# Patient Record
Sex: Male | Born: 1955
Health system: Southern US, Community
[De-identification: ages and names within clinical notes are randomized; demographics above are authoritative.]

## PROBLEM LIST (undated history)

## (undated) DIAGNOSIS — G609 Hereditary and idiopathic neuropathy, unspecified: Secondary | ICD-10-CM

## (undated) DIAGNOSIS — N529 Male erectile dysfunction, unspecified: Secondary | ICD-10-CM

## (undated) DIAGNOSIS — I440 Atrioventricular block, first degree: Secondary | ICD-10-CM

## (undated) DIAGNOSIS — C4490 Unspecified malignant neoplasm of skin, unspecified: Secondary | ICD-10-CM

## (undated) DIAGNOSIS — R200 Anesthesia of skin: Secondary | ICD-10-CM

## (undated) DIAGNOSIS — J309 Allergic rhinitis, unspecified: Secondary | ICD-10-CM

## (undated) DIAGNOSIS — G629 Polyneuropathy, unspecified: Secondary | ICD-10-CM

## (undated) DIAGNOSIS — R002 Palpitations: Secondary | ICD-10-CM

## (undated) DIAGNOSIS — C44519 Basal cell carcinoma of skin of other part of trunk: Secondary | ICD-10-CM

## (undated) HISTORY — DX: Allergic rhinitis, unspecified: J30.9

## (undated) HISTORY — DX: Atrioventricular block, first degree: I44.0

## (undated) HISTORY — DX: Palpitations: R00.2

## (undated) HISTORY — DX: Anesthesia of skin: R20.0

## (undated) HISTORY — DX: Basal cell carcinoma of skin of other part of trunk: C44.519

## (undated) HISTORY — DX: Hereditary and idiopathic neuropathy, unspecified: G60.9

## (undated) HISTORY — DX: Unspecified malignant neoplasm of skin, unspecified: C44.90

## (undated) HISTORY — DX: Male erectile dysfunction, unspecified: N52.9

---
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
## 2009-06-13 ENCOUNTER — Emergency Department (HOSPITAL_BASED_OUTPATIENT_CLINIC_OR_DEPARTMENT_OTHER): Admission: EM | Admit: 2009-06-13 | Discharge: 2009-06-14 | Payer: Self-pay | Admitting: Emergency Medicine

## 2009-06-14 ENCOUNTER — Ambulatory Visit: Payer: Self-pay | Admitting: Diagnostic Radiology

## 2009-06-15 ENCOUNTER — Telehealth: Payer: Self-pay | Admitting: Cardiology

## 2009-06-15 DIAGNOSIS — R002 Palpitations: Secondary | ICD-10-CM | POA: Insufficient documentation

## 2009-06-27 ENCOUNTER — Telehealth: Payer: Self-pay | Admitting: Cardiology

## 2009-07-17 ENCOUNTER — Ambulatory Visit: Payer: Self-pay

## 2009-07-17 ENCOUNTER — Ambulatory Visit (HOSPITAL_COMMUNITY): Admission: RE | Admit: 2009-07-17 | Discharge: 2009-07-17 | Payer: Self-pay | Admitting: Cardiology

## 2009-07-17 ENCOUNTER — Encounter: Payer: Self-pay | Admitting: Cardiology

## 2009-07-17 ENCOUNTER — Ambulatory Visit: Payer: Self-pay | Admitting: Internal Medicine

## 2009-07-24 ENCOUNTER — Ambulatory Visit: Payer: Self-pay | Admitting: Cardiology

## 2010-02-12 NOTE — Progress Notes (Signed)
Summary: schedule 48 hour holter  Phone Note Outgoing Call   Call placed by: Katina Dung, RN, BSN,  June 15, 2009 12:40 PM Call placed to: Patient Summary of Call: schedule post ER 48 hour holter  Follow-up for Phone Call        pt was seen in ER 06/13/09--reviewed with Dr Darvin Neighbours recommended 48 holter monitor for palpitations and appt with him after pt has completed monitor --appt with Dr Shirlee Latch is scheduled for 06/27/09--LMTCB Katina Dung, RN, BSN  June 15, 2009 12:42 PM  discussed with pt-he agreed to 48 hour monitor-he has BC/BS insurance  New Problems: PALPITATIONS (ICD-785.1)   New Problems: PALPITATIONS (ICD-785.1)

## 2010-02-12 NOTE — Assessment & Plan Note (Signed)
Summary: np6/seen in er/jss   Primary Provider:  Dr. Foy Guadalajara  CC:  new patient seen in er.  Pt feeling great today. Marland Kitchen  History of Present Illness: 55 yo presents for evaluation of palpitations after recent ER visit.  Patient went to the ER in 6/11 with irregular heart beat and palpitations.  This developed after he started doxycycline that had been prescribed by his PCP for tick exposure.  He felt his heart skipping beats for about 6 hours, then it resolved.  It really has not happened since.  In the ER, PVCs were noted on telemetry.  His electrolytes appeared normal and cardiac enzymes were negative.  Currently, patient is doing well with no palpitations.  He has no exertional dyspnea or chest pain and is doing well in general.  He had a holter done this month (of note, he was having no palpitations).  This showed only a few PVCs.  Echo showed a structurally normal heart.  Of note, patient does drink a significant amount of caffeine.  No recent over the counter cold meds.    ECG: NSR, normal (from 6/11 ER visit)  Labs (6/11): HCT 41.8, K 4.1, creatinine 0.8, cardiac enzymes negative   Current Medications (verified): 1)  None  Allergies (verified): No Known Drug Allergies  Past History:  Social History: Last updated: 07/24/2009 Tobacco Use - No.  Alcohol Use - no Drug Use - no Married Drinks a significant amount of caffeine.   Past Medical History: 1.  Palpitations: Holter (7/11) with occasional PACs, no significant arrhythmia. Narrow QRS, normal QTc.  2.  Echo (7/11): EF 50-55% with no regional wall motion abnormalities and normal diastolic function.  No valvular abnormalities.    Family History: No premature CAD or sudden death.   Social History: Tobacco Use - No.  Alcohol Use - no Drug Use - no Married Drinks a significant amount of caffeine.   Review of Systems       All systems reviewed and negative except as per HPI.   Vital Signs:  Patient profile:   55 year old  male Height:      73 inches Weight:      183 pounds BMI:     24.23 Pulse rate:   74 / minute Pulse rhythm:   regular BP sitting:   120 / 72  (left arm) Cuff size:   regular  Vitals Entered By: [name] CMA (July 24, 2009 3:40 PM)  Physical Exam  General:  Well developed, well nourished, in no acute distress. Head:  normocephalic and atraumatic Nose:  no deformity, discharge, inflammation, or lesions Mouth:  Teeth, gums and palate normal. Oral mucosa normal. Neck:  Neck supple, no JVD. No masses, thyromegaly or abnormal cervical nodes. Lungs:  Clear bilaterally to auscultation and percussion. Heart:  Non-displaced PMI, chest non-tender; regular rate and rhythm, S1, S2 without murmurs, rubs or gallops. Carotid upstroke normal, no bruit.  Pedals normal pulses. No edema, no varicosities. Abdomen:  Bowel sounds positive; abdomen soft and non-tender without masses, organomegaly, or hernias noted. No hepatosplenomegaly. Msk:  Back normal, normal gait. Muscle strength and tone normal. Extremities:  No clubbing or cyanosis. Neurologic:  Alert and oriented x 3. Skin:  Intact without lesions or rashes. Psych:  Normal affect.   Impression & Recommendations:  Problem # 1:  PALPITATIONS (ICD-785.1) Patient had an episode of about 6 hours of frequent palpitations after taking doxycycline.  PVCs were noted in the ER.  Subsequent Holter showed only rare  PACs and echo showed a structurally normal heart.  Doxycycline is not known to significantly increase arrhythmia risk.  At this time, the patient is doing well with no symptoms and I do not think any further workup is needed.   Patient Instructions: 1)  Your physician recommends that you schedule a follow-up appointment as needed with Dr Shirlee Latch.

## 2010-02-12 NOTE — Progress Notes (Signed)
Summary: call back after his meeting  Phone Note Call from Patient Call back at Home Phone [phone]   Caller: Patient Reason for Call: Talk to Nurse Summary of Call: per pt calling back, will call back after his meeting. Initial call taken by: Lorne Skeens,  June 27, 2009 9:06 AM  Follow-up for Phone Call        talked with patient by telephone--He states he was never contacted to schedule holter monitor--pt states since the day after the  ER visit 06-13-09 he has not had anymore symptoms--I discussed with Dr Darvin Neighbours recommended if pt has been asymptomatic he could get echo and 48 hour monitor then see Dr Shirlee Latch in the office following completion of the echo and monitor--pt to call me back after meeting to discuss--I talked with pt --he will schedule echo and holter monitor then followup  with Dr Shirlee Latch

## 2010-02-12 NOTE — Procedures (Signed)
Summary: summary report  summary report   Imported By: Mirna Mires 07/30/2009 10:05:04  _____________________________________________________________________  External Attachment:    Type:   Image     Comment:   External Document

## 2010-04-01 LAB — BASIC METABOLIC PANEL WITH GFR
BUN: 14 mg/dL (ref 6–23)
CO2: 27 meq/L (ref 19–32)
Calcium: 8.9 mg/dL (ref 8.4–10.5)
Chloride: 104 meq/L (ref 96–112)
Creatinine, Ser: 0.8 mg/dL (ref 0.4–1.5)
GFR calc non Af Amer: >60 mL/min
Glucose, Bld: 149 mg/dL — ABNORMAL HIGH (ref 70–99)
Potassium: 4.1 meq/L (ref 3.5–5.1)
Sodium: 142 meq/L (ref 135–145)

## 2010-04-01 LAB — CBC
HCT: 41.8 % (ref 39.0–52.0)
Hemoglobin: 14.2 g/dL (ref 13.0–17.0)
MCHC: 34 g/dL (ref 30.0–36.0)
MCV: 92.6 fL (ref 78.0–100.0)
Platelets: 237 K/uL (ref 150–400)
RBC: 4.51 MIL/uL (ref 4.22–5.81)
RDW: 11.7 % (ref 11.5–15.5)
WBC: 5.9 K/uL (ref 4.0–10.5)

## 2010-04-01 LAB — DIFFERENTIAL
Eosinophils Absolute: 0.1 10*3/uL (ref 0.0–0.7)
Eosinophils Relative: 2 % (ref 0–5)
Lymphs Abs: 1.1 10*3/uL (ref 0.7–4.0)
Monocytes Absolute: 0.8 10*3/uL (ref 0.1–1.0)
Neutro Abs: 3.9 10*3/uL (ref 1.7–7.7)

## 2010-04-01 LAB — POCT CARDIAC MARKERS
CKMB, poc: <1 ng/mL — ABNORMAL LOW (ref 1.0–8.0)
Myoglobin, poc: 72 ng/mL (ref 12–200)
Troponin i, poc: <0.05 ng/mL (ref 0.00–0.09)

## 2010-04-01 LAB — TSH: TSH: 2.081 u[IU]/mL (ref 0.350–4.500)

## 2011-06-02 IMAGING — CR DG CHEST 2V
2 series · 2 of 2 positions shown · non-contrast
Comparison: None.

CLINICAL DATA: Irregular heart beat.

CHEST - 2 VIEW

[w chest pa]
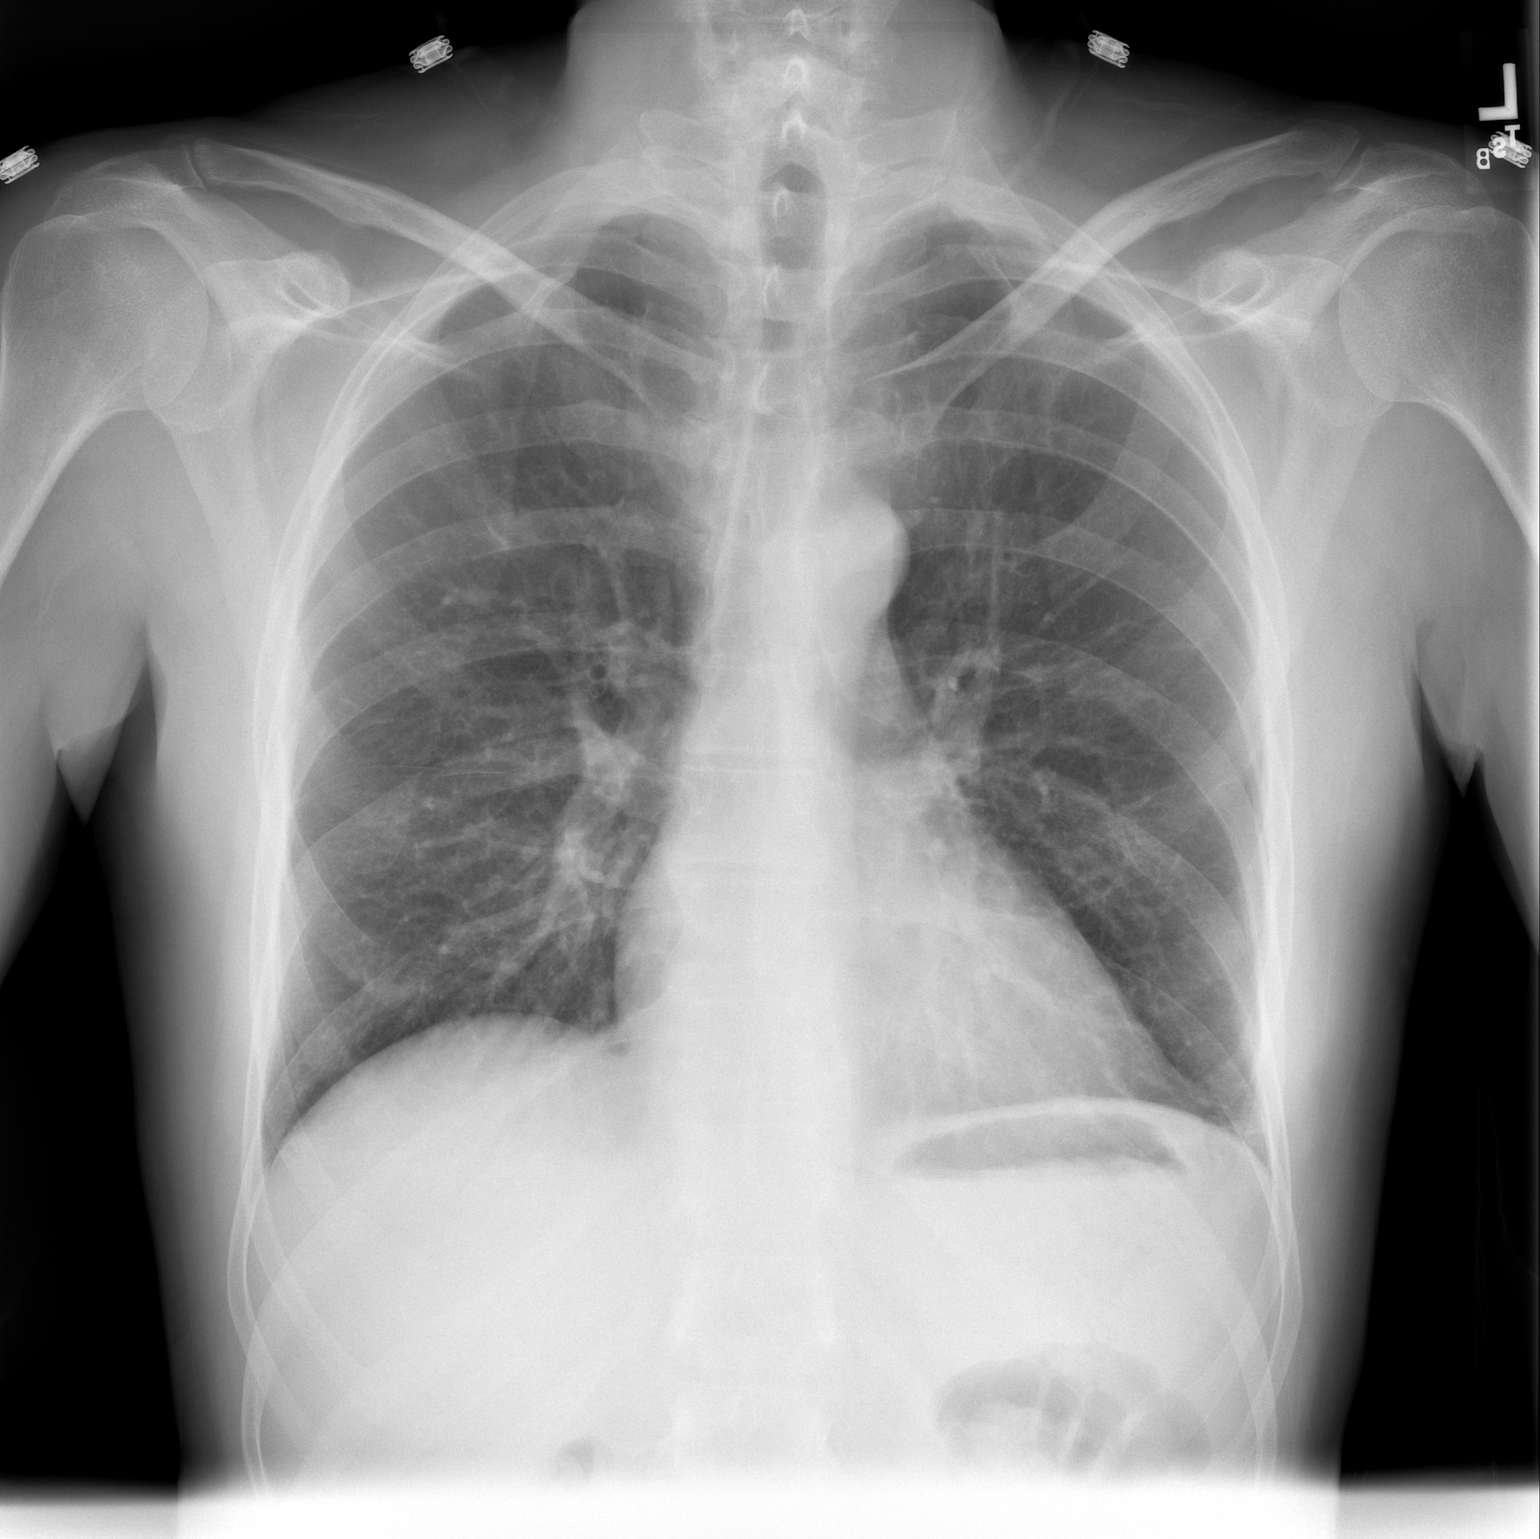

[w chest lat]
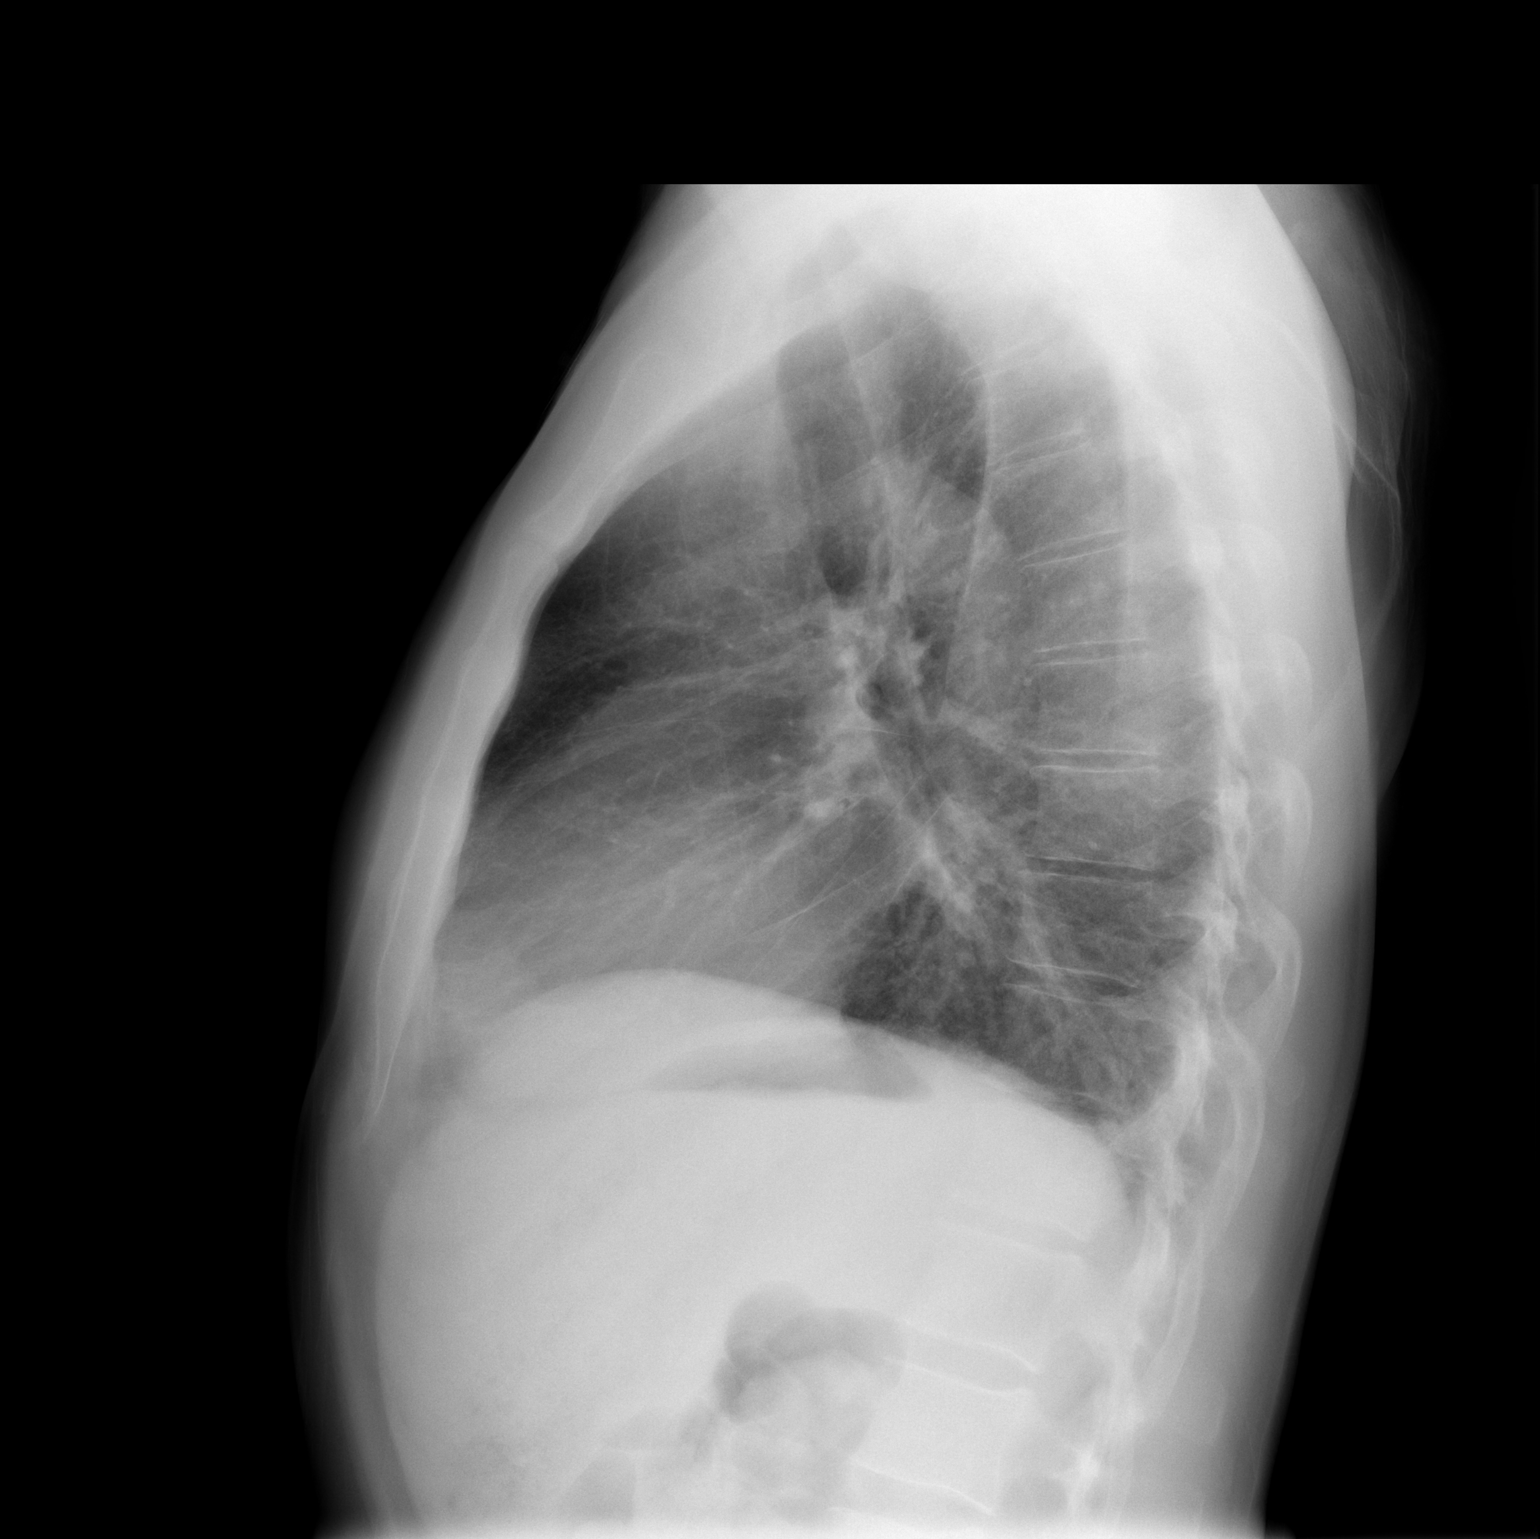

[2 of 2 positions shown; findings below may reference images not displayed]

FINDINGS: The lungs are clear.  There is no pleural effusion.
Heart size normal.  No focal bony abnormality.
IMPRESSION: No acute disease.

## 2011-09-18 ENCOUNTER — Encounter: Payer: Self-pay | Admitting: Cardiology

## 2011-09-18 ENCOUNTER — Encounter: Payer: Self-pay | Admitting: *Deleted

## 2012-09-29 ENCOUNTER — Other Ambulatory Visit (HOSPITAL_BASED_OUTPATIENT_CLINIC_OR_DEPARTMENT_OTHER): Payer: Self-pay | Admitting: Family Medicine

## 2012-09-29 ENCOUNTER — Ambulatory Visit (HOSPITAL_BASED_OUTPATIENT_CLINIC_OR_DEPARTMENT_OTHER)
Admission: RE | Admit: 2012-09-29 | Discharge: 2012-09-29 | Disposition: A | Discharge status: Home / Self Care | Payer: BC Managed Care – PPO | Source: Ambulatory Visit | Attending: Family Medicine | Admitting: Family Medicine

## 2012-09-29 DIAGNOSIS — N5089 Other specified disorders of the male genital organs: Secondary | ICD-10-CM

## 2014-09-18 IMAGING — US US SCROTUM
1 series · 13 of 25 positions shown · non-contrast
Comparison: None.

CLINICAL DATA: Left testicular pain and swelling.

EXAM:
SCROTAL ULTRASOUND
DOPPLER ULTRASOUND OF THE TESTICLES
TECHNIQUE: Complete ultrasound examination of the testicles, epididymis, and
other scrotal structures was performed. Color and spectral Doppler
ultrasound were also utilized to evaluate blood flow to the
testicles.

[Series 1: us scrotum · 0.10mm/px · 13 of 52 slices shown]
[im 1/52]
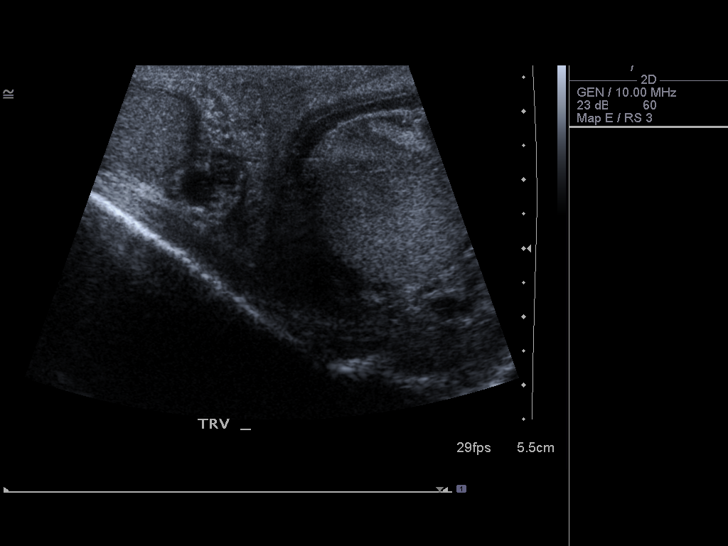
[im 5/52]
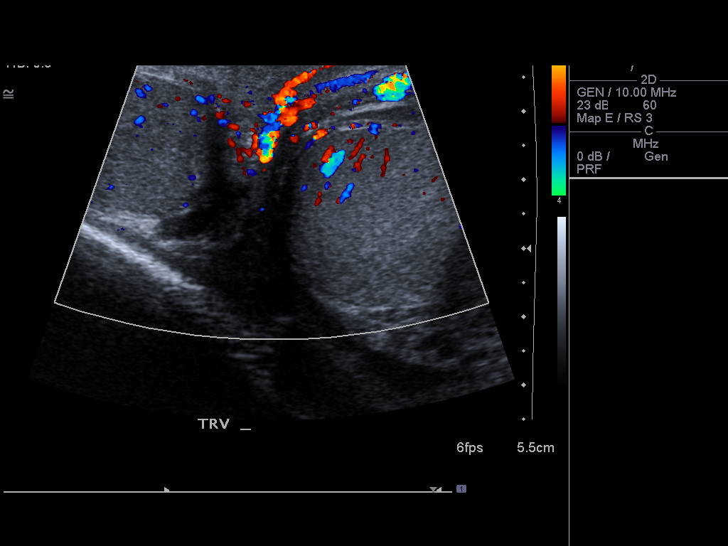
[im 9/52]
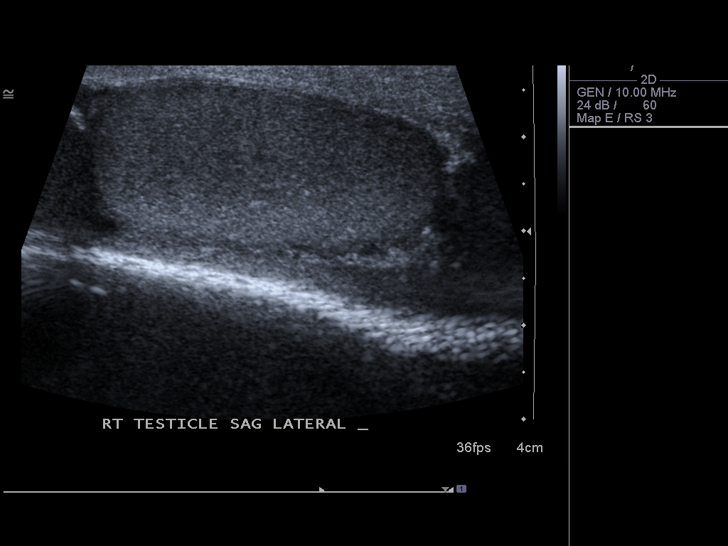
[im 13/52]
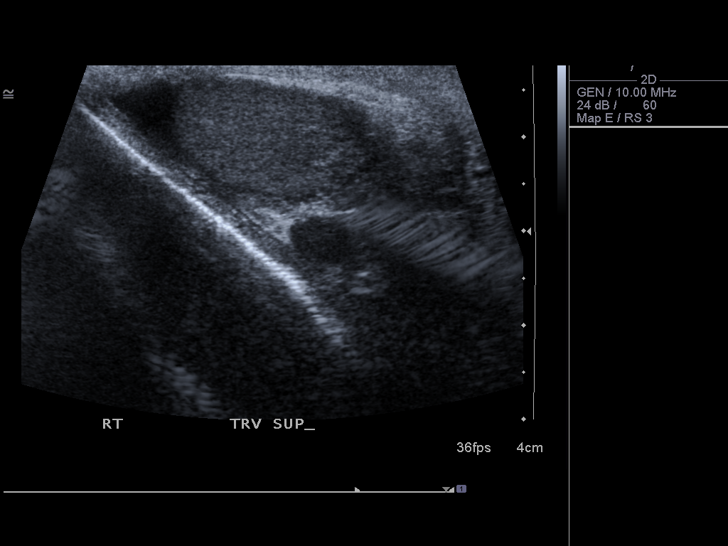
[im 18/52]
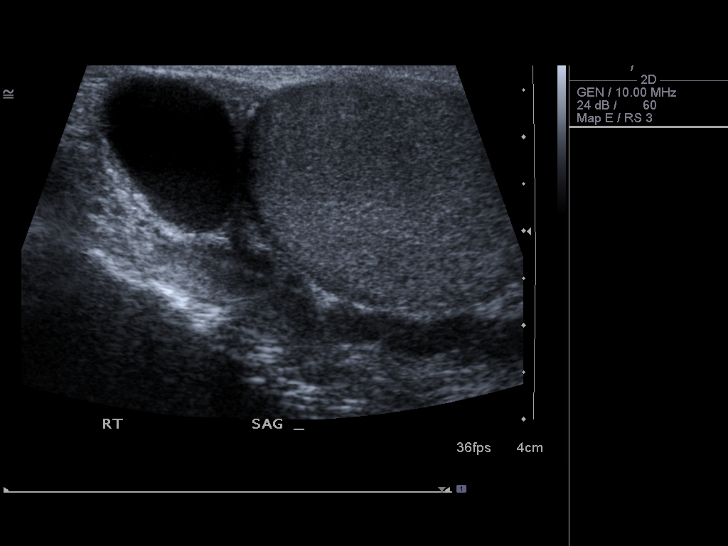
[im 22/52]
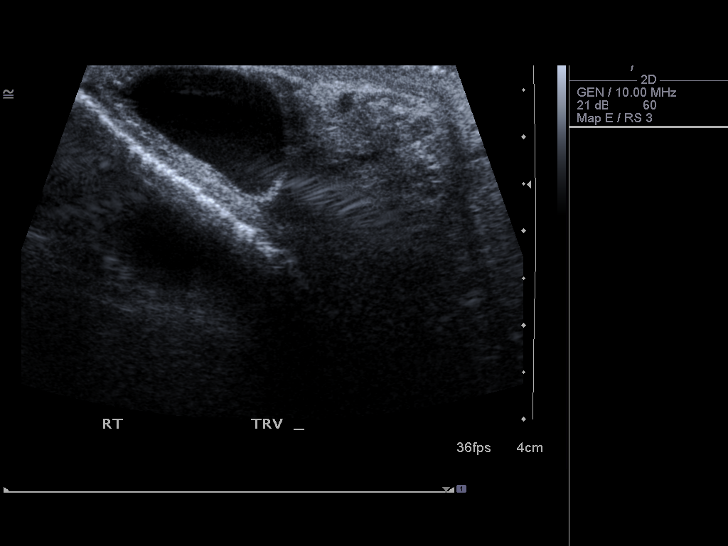
[im 26/52]
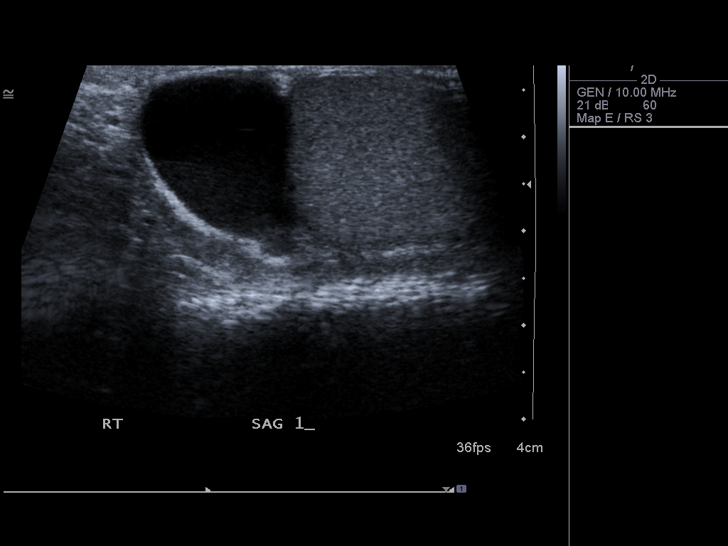
[im 30/52]
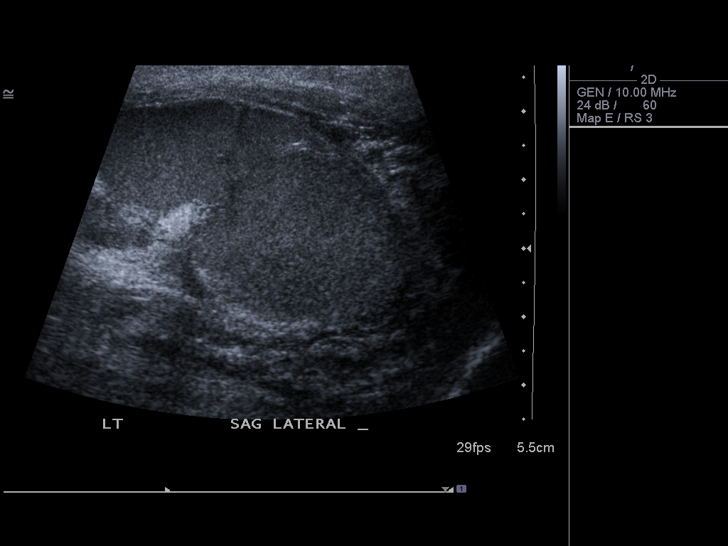
[im 35/52]
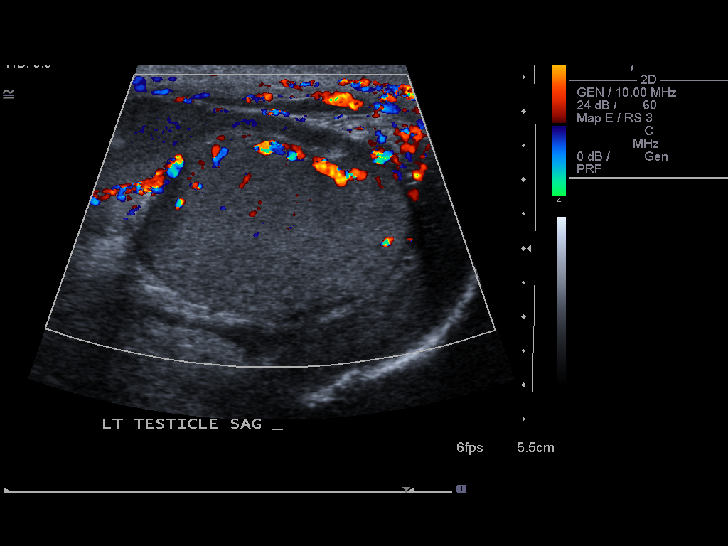
[im 39/52]
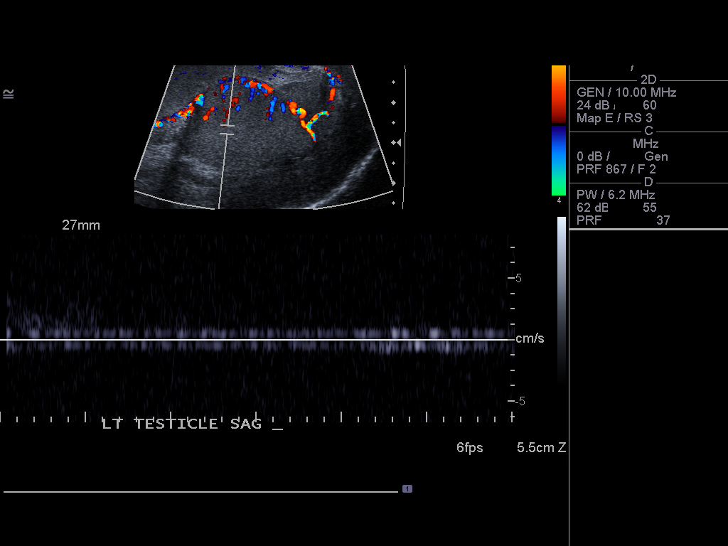
[im 43/52]
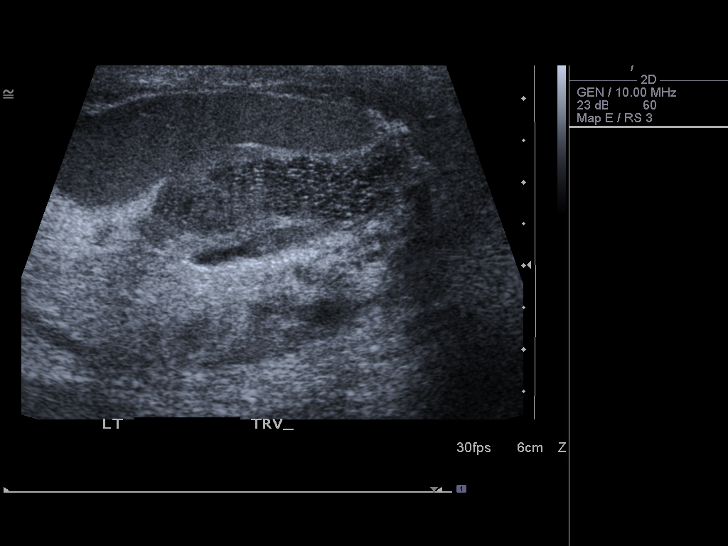
[im 47/52]
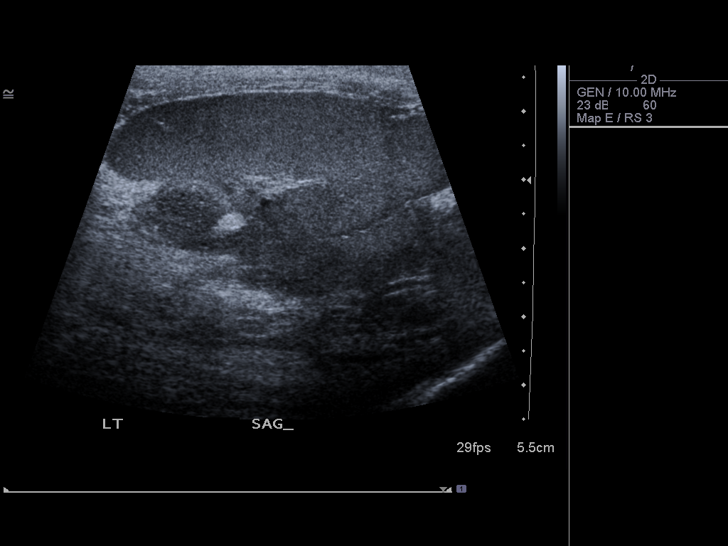
[im 52/52]
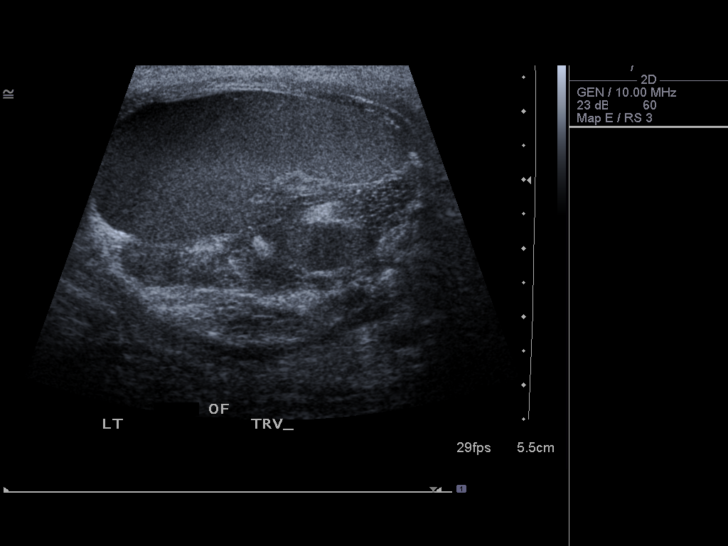

[13 of 25 positions shown; findings below may reference images not displayed]

FINDINGS: Right testis: Measures 4.0 x 2.1 x 2.8 cm. Normal appearance of the
right testicle with arterial and venous flow.

Left testis: Measures 4.0 x 2.6 x 2.5 cm. The left testicle has a
normal appearance. There is a large echogenic structure or
collection surrounding the left epididymis and testicle. This
collection roughly measures 5.1 x 4.6 x 2.7 cm. There is arterial
and venous flow within the left testicle.

Right epididymis: There is a large oval-shaped hypoechoic structure
in the right epididymal head. This structure measures 1.6 x 1.8 x
1.8 cm and consistent with a cyst.

Left epididymis: Left epididymis may be edematous and slightly
hypervascular.

Hydrocele:  Possible large complex left hydrocele.

Varicocele:  Absent

Pulsed Doppler interrogation of both testes demonstrates low
resistance arterial and venous waveforms bilaterally.
IMPRESSION: There is a large echogenic structure or collection surrounding the
left testicle. Findings are suggestive for a complex hydrocele.
There is slightly increased blood flow to the left testicle and left
epididymis which raises concern for an underlying infectious or
inflammatory process. These findings may be secondary to acute
epididymo-orchitis. Due to the large and unusual appearance of the
echogenic structure around the left testicle, recommend Urology
consultation and/or followup imaging.

Right epididymal cyst or spermatocele.

Negative for testicular torsion.

## 2016-02-12 DIAGNOSIS — D126 Benign neoplasm of colon, unspecified: Secondary | ICD-10-CM | POA: Diagnosis not present

## 2016-02-12 DIAGNOSIS — Z1211 Encounter for screening for malignant neoplasm of colon: Secondary | ICD-10-CM | POA: Diagnosis not present

## 2016-02-15 DIAGNOSIS — Z1211 Encounter for screening for malignant neoplasm of colon: Secondary | ICD-10-CM | POA: Diagnosis not present

## 2016-02-15 DIAGNOSIS — D126 Benign neoplasm of colon, unspecified: Secondary | ICD-10-CM | POA: Diagnosis not present

## 2016-05-21 DIAGNOSIS — D485 Neoplasm of uncertain behavior of skin: Secondary | ICD-10-CM | POA: Diagnosis not present

## 2016-05-21 DIAGNOSIS — Z13220 Encounter for screening for lipoid disorders: Secondary | ICD-10-CM | POA: Diagnosis not present

## 2016-05-21 DIAGNOSIS — Z125 Encounter for screening for malignant neoplasm of prostate: Secondary | ICD-10-CM | POA: Diagnosis not present

## 2016-05-21 DIAGNOSIS — Z131 Encounter for screening for diabetes mellitus: Secondary | ICD-10-CM | POA: Diagnosis not present

## 2016-05-21 DIAGNOSIS — Z0000 Encounter for general adult medical examination without abnormal findings: Secondary | ICD-10-CM | POA: Diagnosis not present

## 2016-07-14 DIAGNOSIS — C44519 Basal cell carcinoma of skin of other part of trunk: Secondary | ICD-10-CM | POA: Diagnosis not present

## 2016-07-14 DIAGNOSIS — L570 Actinic keratosis: Secondary | ICD-10-CM | POA: Diagnosis not present

## 2016-07-14 DIAGNOSIS — C44319 Basal cell carcinoma of skin of other parts of face: Secondary | ICD-10-CM | POA: Diagnosis not present

## 2016-09-01 DIAGNOSIS — Z85828 Personal history of other malignant neoplasm of skin: Secondary | ICD-10-CM | POA: Diagnosis not present

## 2016-09-01 DIAGNOSIS — L905 Scar conditions and fibrosis of skin: Secondary | ICD-10-CM | POA: Diagnosis not present

## 2017-01-13 HISTORY — PX: SKIN CANCER EXCISION: SHX779

## 2017-01-20 DIAGNOSIS — Z85828 Personal history of other malignant neoplasm of skin: Secondary | ICD-10-CM | POA: Diagnosis not present

## 2017-01-20 DIAGNOSIS — L905 Scar conditions and fibrosis of skin: Secondary | ICD-10-CM | POA: Diagnosis not present

## 2017-06-11 DIAGNOSIS — Z0000 Encounter for general adult medical examination without abnormal findings: Secondary | ICD-10-CM | POA: Diagnosis not present

## 2017-06-11 DIAGNOSIS — Z13220 Encounter for screening for lipoid disorders: Secondary | ICD-10-CM | POA: Diagnosis not present

## 2017-06-11 DIAGNOSIS — Z125 Encounter for screening for malignant neoplasm of prostate: Secondary | ICD-10-CM | POA: Diagnosis not present

## 2017-06-11 DIAGNOSIS — R200 Anesthesia of skin: Secondary | ICD-10-CM | POA: Diagnosis not present

## 2017-06-11 DIAGNOSIS — Z131 Encounter for screening for diabetes mellitus: Secondary | ICD-10-CM | POA: Diagnosis not present

## 2017-07-03 ENCOUNTER — Other Ambulatory Visit (HOSPITAL_BASED_OUTPATIENT_CLINIC_OR_DEPARTMENT_OTHER): Payer: Self-pay | Admitting: Physician Assistant

## 2017-07-03 DIAGNOSIS — R200 Anesthesia of skin: Secondary | ICD-10-CM

## 2017-07-22 ENCOUNTER — Ambulatory Visit (HOSPITAL_BASED_OUTPATIENT_CLINIC_OR_DEPARTMENT_OTHER): Payer: BLUE CROSS/BLUE SHIELD

## 2018-02-04 DIAGNOSIS — L820 Inflamed seborrheic keratosis: Secondary | ICD-10-CM | POA: Diagnosis not present

## 2018-02-04 DIAGNOSIS — L814 Other melanin hyperpigmentation: Secondary | ICD-10-CM | POA: Diagnosis not present

## 2018-02-04 DIAGNOSIS — D225 Melanocytic nevi of trunk: Secondary | ICD-10-CM | POA: Diagnosis not present

## 2018-02-04 DIAGNOSIS — C44329 Squamous cell carcinoma of skin of other parts of face: Secondary | ICD-10-CM | POA: Diagnosis not present

## 2018-02-04 DIAGNOSIS — L821 Other seborrheic keratosis: Secondary | ICD-10-CM | POA: Diagnosis not present

## 2018-02-04 DIAGNOSIS — L570 Actinic keratosis: Secondary | ICD-10-CM | POA: Diagnosis not present

## 2018-02-04 DIAGNOSIS — D1801 Hemangioma of skin and subcutaneous tissue: Secondary | ICD-10-CM | POA: Diagnosis not present

## 2018-03-03 DIAGNOSIS — C44319 Basal cell carcinoma of skin of other parts of face: Secondary | ICD-10-CM | POA: Diagnosis not present

## 2018-06-16 DIAGNOSIS — Z13220 Encounter for screening for lipoid disorders: Secondary | ICD-10-CM | POA: Diagnosis not present

## 2018-06-16 DIAGNOSIS — Z0000 Encounter for general adult medical examination without abnormal findings: Secondary | ICD-10-CM | POA: Diagnosis not present

## 2018-06-16 DIAGNOSIS — Z131 Encounter for screening for diabetes mellitus: Secondary | ICD-10-CM | POA: Diagnosis not present

## 2018-06-16 DIAGNOSIS — Z125 Encounter for screening for malignant neoplasm of prostate: Secondary | ICD-10-CM | POA: Diagnosis not present

## 2018-06-22 ENCOUNTER — Other Ambulatory Visit: Payer: Self-pay | Admitting: Physician Assistant

## 2018-06-22 DIAGNOSIS — R200 Anesthesia of skin: Secondary | ICD-10-CM

## 2018-07-01 ENCOUNTER — Ambulatory Visit
Admission: RE | Admit: 2018-07-01 | Discharge: 2018-07-01 | Disposition: A | Discharge status: Home / Self Care | Payer: BLUE CROSS/BLUE SHIELD | Source: Ambulatory Visit | Attending: Physician Assistant | Admitting: Physician Assistant

## 2018-07-01 DIAGNOSIS — R202 Paresthesia of skin: Secondary | ICD-10-CM | POA: Diagnosis not present

## 2018-07-01 DIAGNOSIS — R200 Anesthesia of skin: Secondary | ICD-10-CM

## 2018-09-07 ENCOUNTER — Other Ambulatory Visit: Payer: Self-pay

## 2018-09-07 ENCOUNTER — Ambulatory Visit (INDEPENDENT_AMBULATORY_CARE_PROVIDER_SITE_OTHER): Payer: BC Managed Care – PPO | Admitting: Neurology

## 2018-09-07 ENCOUNTER — Encounter: Payer: Self-pay | Admitting: Neurology

## 2018-09-07 DIAGNOSIS — G609 Hereditary and idiopathic neuropathy, unspecified: Secondary | ICD-10-CM

## 2018-09-07 DIAGNOSIS — G629 Polyneuropathy, unspecified: Secondary | ICD-10-CM

## 2018-09-07 HISTORY — DX: Polyneuropathy, unspecified: G62.9

## 2018-09-07 NOTE — Procedures (Signed)
     HISTORY:  John Caldwell is a 63 year old gentleman with a history of some numbness in the toes dating back about 2 years.  He denies any significant discomfort or changes in balance.  He reports no back pain or pain down the legs.  He is being evaluated for the numbness.  NERVE CONDUCTION STUDIES:  Nerve conduction studies were performed on the right upper extremity.  The distal motor latencies and motor amplitudes for the median and ulnar nerves were normal with normal nerve conduction velocities seen for these nerves.  The sensory latency for the right median nerve was slightly prolonged and was normal for the right ulnar nerve.  The F-wave latencies for the right median and ulnar nerves were normal.  Nerve conduction studies were performed on both lower extremities.  The distal motor latencies for the peroneal and posterior tibial nerves were normal bilaterally with low motor amplitudes seen for these nerves.  Slowing was seen for the peroneal and posterior tibial nerves bilaterally.  The sural sensory latencies were within normal limits bilaterally but prolongation of the peroneal sensory latencies were seen bilaterally.  There was prolongation of the posterior tibial F wave latencies bilaterally, the peroneal nerve F wave latencies were unobtainable bilaterally.  EMG STUDIES:  EMG study was performed on the right lower extremity:  The tibialis anterior muscle reveals 2 to 5K motor units with decreased recruitment. No fibrillations or positive waves were seen. The peroneus tertius muscle reveals 2 to 6K motor units with decreased recruitment. No fibrillations or positive waves were seen. The medial gastrocnemius muscle reveals 1 to 3K motor units with decreased recruitment. No fibrillations or positive waves were seen. The vastus lateralis muscle reveals 2 to 4K motor units with full recruitment. No fibrillations or positive waves were seen. The iliopsoas muscle reveals 2 to 4K motor units  with full recruitment. No fibrillations or positive waves were seen. The biceps femoris muscle (long head) reveals 2 to 4K motor units with full recruitment. No fibrillations or positive waves were seen. The lumbosacral paraspinal muscles were tested at 3 levels, and revealed no abnormalities of insertional activity at all 3 levels tested. There was good relaxation.   IMPRESSION:  Nerve conduction studies done on the right upper extremity and both lower extremities shows evidence of a primarily axonal peripheral neuropathy of moderate severity.  EMG evaluation of the right lower extremity shows chronic stable neuropathic signs of denervation distally consistent with the diagnosis of peripheral neuropathy.  There is no evidence of an overlying lumbosacral radiculopathy.  Jill Alexanders MD 09/07/2018 12:40 PM  [facility] [address] [place], [place] [address]  Phone [phone] Fax [phone]

## 2018-09-07 NOTE — Progress Notes (Signed)
Please refer to EMG and nerve conduction procedure note.  

## 2018-09-08 NOTE — Progress Notes (Signed)
[facility]    Nerve / Sites Muscle Latency Ref. Amplitude Ref. Rel Amp Segments Distance Velocity Ref. Area    ms ms mV mV %  cm m/s m/s mVms  R Median - APB     Wrist APB 3.8 ?4.4 5.9 ?4.0 100 Wrist - APB 7   22.4     Upper arm APB 8.6  4.7  80 Upper arm - Wrist 25 52 ?49 28.3  R Ulnar - ADM     Wrist ADM 2.2 ?3.3 9.8 ?6.0 100 Wrist - ADM 7   34.3     B.Elbow ADM 6.1  8.7  89.3 B.Elbow - Wrist 23 60 ?49 29.6     A.Elbow ADM 7.8  8.4  96.2 A.Elbow - B.Elbow 10 58 ?49 29.8         A.Elbow - Wrist      R Peroneal - EDB     Ankle EDB 5.8 ?6.5 0.5 ?2.0 100 Ankle - EDB 9   3.2     Fib head EDB 16.7  0.6  130 Fib head - Ankle 32 30 ?44 2.7     Pop fossa EDB 18.9  0.6  102 Pop fossa - Fib head 10 45 ?44 2.9         Pop fossa - Ankle      L Peroneal - EDB     Ankle EDB 5.3 ?6.5 1.5 ?2.0 100 Ankle - EDB 9   7.5     Fib head EDB 15.4  1.5  98.9 Fib head - Ankle 33 32 ?44 7.3     Pop fossa EDB 18.1  1.4  95.1 Pop fossa - Fib head 10 38 ?44 7.2         Pop fossa - Ankle      R Tibial - AH     Ankle AH 5.8 ?5.8 0.7 ?4.0 100 Ankle - AH 9   2.3     Pop fossa AH 18.6  0.6  96.8 Pop fossa - Ankle 42 33 ?41 1.6  L Tibial - AH     Ankle AH 4.3 ?5.8 1.8 ?4.0 100 Ankle - AH 9   6.1     Pop fossa AH 18.0  0.5  25.7 Pop fossa - Ankle 42 31 ?41 3.8                  SNC    Nerve / Sites Rec. Site Peak Lat Ref.  Amp Ref. Segments Distance    ms ms V V  cm  R Sural - Ankle (Calf)     Calf Ankle 4.0 ?4.4 8 ?6 Calf - Ankle 14  L Sural - Ankle (Calf)     Calf Ankle 4.0 ?4.4 10 ?6 Calf - Ankle 14  R Superficial peroneal - Ankle     Lat leg Ankle 4.7 ?4.4 6 ?6 Lat leg - Ankle 14  L Superficial peroneal - Ankle     Lat leg Ankle 6.1 ?4.4 2 ?6 Lat leg - Ankle 14  R Median - Orthodromic (Dig II, Mid palm)     Dig II Wrist 3.9 ?3.4 14 ?10 Dig II - Wrist 13  R Ulnar - Orthodromic, (Dig V, Mid palm)     Dig V Wrist 2.9 ?3.1 9 ?5 Dig V - Wrist 68                  F  Wave    Nerve F Lat Ref.  ms ms  R  Peroneal - EDB NR ?56.0  R Tibial - AH 74.3 ?56.0  L Peroneal - EDB NR ?56.0  L Tibial - AH 82.9 ?56.0  R Median - APB 30.8 ?31.0  R Ulnar - ADM 29.7 ?32.0

## 2018-09-27 ENCOUNTER — Encounter: Payer: Self-pay | Admitting: Neurology

## 2018-09-27 ENCOUNTER — Ambulatory Visit (INDEPENDENT_AMBULATORY_CARE_PROVIDER_SITE_OTHER): Payer: BC Managed Care – PPO | Admitting: Neurology

## 2018-09-27 ENCOUNTER — Other Ambulatory Visit: Payer: Self-pay

## 2018-09-27 VITALS — BP 124/61 | HR 68 | Temp 97.3°F | Ht 73.0 in | Wt 179.0 lb

## 2018-09-27 DIAGNOSIS — G609 Hereditary and idiopathic neuropathy, unspecified: Secondary | ICD-10-CM

## 2018-09-27 NOTE — Progress Notes (Signed)
Reason for visit: Peripheral neuropathy  Referring physician: Dr. Dante Caldwell is a 63 y.o. male  History of present illness:  John Caldwell is a 63 year old right-handed white male with a history of numbness in the feet that developed about 3 years ago.  The patient has mainly had symptoms in the distal portions of the feet on both sides.  He likes to ride a unicycle, he has not noted any change in his balance whatsoever.  The patient reports no significant back pain or pain down the legs.  He has no weakness in the legs.  He has not had any neck pain or difficulty with numbness or tingling in the hands.  He has not noted any difficulty controlling the bowels or the bladder.  He does have a first cousin with a similar neuropathy problem.  He is sleeping fairly well at night but he does have some symptoms of tingling in the feet in the evening hours, this is not as noticeable during the day.  He has undergone EMG and nerve conduction study that confirms the presence of a primarily axonal peripheral neuropathy, he is sent to this office for further evaluation.  Past Medical History:  Diagnosis Date  . Palpitations   . Peripheral neuropathy 09/07/2018  . Skin cancer     Past Surgical History:  Procedure Laterality Date  . SKIN CANCER EXCISION  2019    History reviewed. No pertinent family history.  Social history:  reports that he has never smoked. He has never used smokeless tobacco. He reports that he does not drink alcohol or use drugs.  Medications:  Prior to Admission medications   Medication Sig Start Date End Date Taking? Authorizing Provider  sildenafil (VIAGRA) 50 MG tablet Take 50 mg by mouth daily as needed for erectile dysfunction.   Yes [provider]     No Known Allergies  ROS:  Out of a complete 14 system review of symptoms, the patient complains only of the following symptoms, and all other reviewed systems are negative.  Numbness in the feet   Blood pressure 124/61, pulse 68, temperature (!) 97.3 F (36.3 C), temperature source Temporal, height 6\' 1"  (1.854 m), weight 179 lb (81.2 kg).  Physical Exam  General: The patient is alert and cooperative at the time of the examination.  Eyes: Pupils are equal, round, and reactive to light. Discs are flat bilaterally.  Neck: The neck is supple, no carotid bruits are noted.  Respiratory: The respiratory examination is clear.  Cardiovascular: The cardiovascular examination reveals a regular rate and rhythm, no obvious murmurs or rubs are noted.  Skin: Extremities are without significant edema.  Neurologic Exam  Mental status: The patient is alert and oriented x 3 at the time of the examination. The patient has apparent normal recent and remote memory, with an apparently normal attention span and concentration ability.  Cranial nerves: Facial symmetry is not present.  There is mild ptosis of the left eye.  There is good sensation of the face to pinprick and soft touch bilaterally. The strength of the facial muscles and the muscles to head turning and shoulder shrug are normal bilaterally. Speech is well enunciated, no aphasia or dysarthria is noted. Extraocular movements are full. Visual fields are full. The tongue is midline, and the patient has symmetric elevation of the soft palate. No obvious hearing deficits are noted.  Motor: The motor testing reveals 5 over 5 strength of all 4 extremities. Good symmetric  motor tone is noted throughout.  Sensory: Sensory testing is intact to pinprick, soft touch, vibration sensation, and position sense on all 4 extremities. No evidence of extinction is noted.  Coordination: Cerebellar testing reveals good finger-nose-finger and heel-to-shin bilaterally.  Gait and station: Gait is normal. Tandem gait is normal. Romberg is negative. No drift is seen.  The patient is able to walk on heels and the toes bilaterally.  Reflexes: Deep tendon reflexes are  symmetric, but are slightly depressed bilaterally. Toes are downgoing bilaterally.   Assessment/Plan:  1.  Peripheral neuropathy  The patient will be sent for blood work today to evaluate the etiology of the peripheral neuropathy.  He is not having a lot of discomfort in the feet at this time, there is no indication for medical therapy.  He will call our office if this issue changes.  Otherwise, he will follow-up if needed.  Jill Alexanders MD 09/27/2018 8:11 AM  [facility] [address] [place], [place] [address]  Phone [phone] Fax [phone]

## 2018-09-29 LAB — MULTIPLE MYELOMA PANEL, SERUM
Albumin SerPl Elph-Mcnc: 4 g/dL (ref 2.9–4.4)
Albumin/Glob SerPl: 1.7 (ref 0.7–1.7)
Alpha 1: 0.2 g/dL (ref 0.0–0.4)
Alpha2 Glob SerPl Elph-Mcnc: 0.7 g/dL (ref 0.4–1.0)
B-Globulin SerPl Elph-Mcnc: 0.8 g/dL (ref 0.7–1.3)
Gamma Glob SerPl Elph-Mcnc: 0.8 g/dL (ref 0.4–1.8)
Globulin, Total: 2.5 g/dL (ref 2.2–3.9)
IgA/Immunoglobulin A, Serum: 224 mg/dL (ref 61–437)
IgG (Immunoglobin G), Serum: 807 mg/dL (ref 603–1613)
IgM (Immunoglobulin M), Srm: 107 mg/dL (ref 20–172)
Total Protein: 6.5 g/dL (ref 6.0–8.5)

## 2018-09-29 LAB — VITAMIN B12: Vitamin B-12: 572 pg/mL (ref 232–1245)

## 2018-09-29 LAB — B. BURGDORFI ANTIBODIES: Lyme IgG/IgM Ab: <0.91 {ISR} (ref 0.00–0.90)

## 2018-09-29 LAB — ANGIOTENSIN CONVERTING ENZYME: Angio Convert Enzyme: 37 U/L (ref 14–82)

## 2018-09-29 LAB — TSH: TSH: 1.23 u[IU]/mL (ref 0.450–4.500)

## 2018-09-29 LAB — ANA W/REFLEX: Anti Nuclear Antibody (ANA): NEGATIVE

## 2018-09-29 LAB — SEDIMENTATION RATE: Sed Rate: 3 mm/hr (ref 0–30)

## 2018-10-04 ENCOUNTER — Telehealth: Payer: Self-pay

## 2018-10-04 NOTE — Telephone Encounter (Signed)
-----   Message from Kathrynn Ducking, MD sent at 09/29/2018  4:44 PM EDT -----  The blood work results are unremarkable. Please call the patient.  ----- Message ----- From: Lavone Neri Lab Results In Sent: 09/28/2018   7:38 AM EDT To: Kathrynn Ducking, MD

## 2018-10-04 NOTE — Telephone Encounter (Signed)
Requested a call back from the pt to further discuss labs.

## 2018-10-04 NOTE — Telephone Encounter (Signed)
Left second vm asking pt to call back. [facility] # and hours provided.

## 2018-10-04 NOTE — Telephone Encounter (Signed)
Pt called back and advised of results. Pt verbalized understanding.

## 2018-10-04 NOTE — Telephone Encounter (Signed)
Patient returned call please call back at [phone].

## 2018-11-24 DIAGNOSIS — Z20828 Contact with and (suspected) exposure to other viral communicable diseases: Secondary | ICD-10-CM | POA: Diagnosis not present

## 2019-02-28 DIAGNOSIS — I498 Other specified cardiac arrhythmias: Secondary | ICD-10-CM | POA: Diagnosis not present

## 2019-02-28 DIAGNOSIS — R002 Palpitations: Secondary | ICD-10-CM | POA: Diagnosis not present

## 2019-03-01 NOTE — Progress Notes (Signed)
CARDIOLOGY CONSULT NOTE       Patient ID: John Caldwell MRN: [id number] DOB/AGE: [birth year] 64 y.o.  Admit date: (Not on file) Referring Physician: [name] PA C Primary Physician: Garth Bigness Primary Cardiologist: New Reason for Consultation: Palpitations / PVC;s  Active Problems:   * No active hospital problems. *   HPI:  64 y.o. history of peripheral neuropathy and skin cancer Seen by [name] in 2011 for same problem palpitations At that time thought to be related to doxycycline prescribed for tick bite In ER noted PVC;s on telemetry Holter showed infrequent PVCls and had normal TTE with no structural heart disease ECG was normal as were metabolic labs and TSH No Rx advised   He sees Dr [name] in Neurology for neuropathy in his feet Most recent labs in Epic normal TSH 09/27/18 Negative Lyme disease Normal ESR Also had normal resting ABI's 07/01/18 check due to his pedal neuropathy  Feels skips " missing beats in his chest especially at rest on left side No help with less coffee   K 4.4 Cr 0.91 Hct 41.8 TSH 0.95    From [place] has 7 kids 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 Has border collie and is active With no symptoms of syncope, chest pain or dyspnea. No stimulants other than a couple cups of coffee One of his children age 39 at home on spectrum   ROS All other systems reviewed and negative except as noted above  Past Medical History:  Diagnosis Date  . Allergic rhinitis   . Basal cell carcinoma (BCC) of chest   . ED (erectile dysfunction)   . Heart block AV first degree   . Idiopathic peripheral neuropathy   . Numbness of toes   . Palpitations   . Peripheral neuropathy 09/07/2018  . Skin cancer    of the cheek    Family History  Problem Relation Age of Onset  . Cancer Mother        Breast  . Glaucoma Mother   . Dementia Father   . Cancer Father        Prostate    Social History   Socioeconomic History  . Marital status:  Married    Spouse name: Not on file  . Number of children: Not on file  . Years of education: Not on file  . Highest education level: Not on file  Occupational History  . Not on file  Tobacco Use  . Smoking status: Never Smoker  . Smokeless tobacco: Never Used  Substance and Sexual Activity  . Alcohol use: No  . Drug use: No  . Sexual activity: Not on file    Comment: married  Other Topics Concern  . Not on file  Social History Narrative   Right handed    Caffeine~ 2 cups    Social Determinants of Health   Financial Resource Strain:   . Difficulty of Paying Living Expenses: Not on file  Food Insecurity:   . Worried About Charity fundraiser in the Last Year: Not on file  . Ran Out of Food in the Last Year: Not on file  Transportation Needs:   . Lack of Transportation (Medical): Not on file  . Lack of Transportation (Non-Medical): Not on file  Physical Activity:   . Days of Exercise per Week: Not on file  . Minutes of Exercise per Session: Not on file  Stress:   . Feeling of Stress : Not on file  Social  Connections:   . Frequency of Communication with Friends and Family: Not on file  . Frequency of Social Gatherings with Friends and Family: Not on file  . Attends Religious Services: Not on file  . Active Member of Clubs or Organizations: Not on file  . Attends Archivist Meetings: Not on file  . Marital Status: Not on file  Intimate Partner Violence:   . Fear of Current or Ex-Partner: Not on file  . Emotionally Abused: Not on file  . Physically Abused: Not on file  . Sexually Abused: Not on file    Past Surgical History:  Procedure Laterality Date  . SKIN CANCER EXCISION  2019     No current outpatient medications on file.    Physical Exam: Blood pressure 106/70, pulse 68, height 6' 1"  (1.854 m), weight 173 lb (78.5 kg).   Affect appropriate Healthy:  appears stated age 64: normal Neck supple with no adenopathy JVP normal no bruits no  thyromegaly Lungs clear with no wheezing and good diaphragmatic motion Heart:  S1/S2 no murmur, no rub, gallop or click PMI normal Abdomen: benighn, BS positve, no tenderness, no AAA no bruit.  No HSM or HJR Distal pulses intact with no bruits No edema Neuro non-focal Skin warm and dry No muscular weakness   Labs:   Lab Results  Component Value Date   WBC 5.9 06/13/2009   HGB 14.2 06/13/2009   HCT 41.8 06/13/2009   MCV 92.6 06/13/2009   PLT 237 06/13/2009      Radiology: No results found.  EKG: SR trigeminy otherwise normal QT 408 msec    ASSESSMENT AND PLAN:   1. Palpitations:  History of benign PVCls dating back to 2011 at that time had normal Echo Will repeat ETT And echo to r/o structural heart issues and exercise induced arrhythmias that could be more serious. Suspect benign RVOT type origin. He declines PRN medication at this time Discussed needing COVID test prior to ETT and advised him to get vaccine 48 hours holter already ordered and in mail to quantitate PVCls which will have bearing on rX  2. Neuropathy:  Labs ok f/u Dr [name]   Signed: [name] 03/03/2019, 8:15 AM

## 2019-03-02 ENCOUNTER — Telehealth: Payer: Self-pay | Admitting: *Deleted

## 2019-03-02 ENCOUNTER — Other Ambulatory Visit: Payer: Self-pay

## 2019-03-02 DIAGNOSIS — I493 Ventricular premature depolarization: Secondary | ICD-10-CM

## 2019-03-02 DIAGNOSIS — R002 Palpitations: Secondary | ICD-10-CM

## 2019-03-02 NOTE — Telephone Encounter (Signed)
Patient enrolled for [employer] to mail a 3 day ZIO XT long term holter monitor to his home.  Instructions reviewed briefly as they are included in the monitor kit. 

## 2019-03-02 NOTE — Progress Notes (Signed)
Patient needs holter monitor, per Dr. Johnsie Cancel.

## 2019-03-03 ENCOUNTER — Telehealth: Payer: Self-pay

## 2019-03-03 ENCOUNTER — Encounter: Payer: Self-pay | Admitting: Cardiovascular Disease

## 2019-03-03 ENCOUNTER — Telehealth (INDEPENDENT_AMBULATORY_CARE_PROVIDER_SITE_OTHER): Payer: BC Managed Care – PPO | Admitting: Cardiovascular Disease

## 2019-03-03 VITALS — BP 106/70 | HR 68 | Ht 73.0 in | Wt 173.0 lb

## 2019-03-03 DIAGNOSIS — I493 Ventricular premature depolarization: Secondary | ICD-10-CM | POA: Diagnosis not present

## 2019-03-03 NOTE — Telephone Encounter (Signed)
Called patient with instructions for ETT. Sent message to [facility] to help schedule ETT and echo. Patient will need an appointment with Dr. Johnsie Cancel after test are complete.

## 2019-03-03 NOTE — Patient Instructions (Addendum)
Medication Instructions:  *If you need a refill on your cardiac medications before your next appointment, please call your pharmacy*  Lab Work: If you have labs (blood work) drawn today and your tests are completely normal, you will receive your results only by: Marland Kitchen MyChart Message (if you have MyChart) OR . A paper copy in the mail If you have any lab test that is abnormal or we need to change your treatment, we will call you to review the results.  Testing/Procedures: Your physician has requested that you have an exercise tolerance test. For further information please visit [email or web address]. Please also follow instruction sheet, as given.  Your physician has requested that you have an echocardiogram. Echocardiography is a painless test that uses sound waves to create images of your heart. It provides your doctor with information about the size and shape of your heart and how well your heart's chambers and valves are working. This procedure takes approximately one hour. There are no restrictions for this procedure.  Follow-Up: At [facility], you and your health needs are our priority.  As part of our continuing mission to provide you with exceptional heart care, we have created designated Provider Care Teams.  These Care Teams include your primary Cardiologist (physician) and Advanced Practice Providers (APPs -  Physician Assistants and Nurse Practitioners) who all work together to provide you with the care you need, when you need it.  Your next appointment:   After testing is complete  The format for your next appointment:   In Person  Provider:   You may see Dr. Johnsie Cancel or one of the following Advanced Practice Providers on your designated Care Team:    Truitt Merle, NP  Cecilie Kicks, NP  Kathyrn Drown, NP   Someone from our office will call you to schedule test and follow up visit. Thank you!

## 2019-03-08 ENCOUNTER — Ambulatory Visit (INDEPENDENT_AMBULATORY_CARE_PROVIDER_SITE_OTHER): Payer: BC Managed Care – PPO

## 2019-03-08 DIAGNOSIS — I493 Ventricular premature depolarization: Secondary | ICD-10-CM | POA: Diagnosis not present

## 2019-03-08 DIAGNOSIS — R002 Palpitations: Secondary | ICD-10-CM | POA: Diagnosis not present

## 2019-03-25 ENCOUNTER — Telehealth: Payer: Self-pay | Admitting: Cardiovascular Disease

## 2019-03-25 DIAGNOSIS — I472 Ventricular tachycardia: Secondary | ICD-10-CM

## 2019-03-25 DIAGNOSIS — I493 Ventricular premature depolarization: Secondary | ICD-10-CM

## 2019-03-25 DIAGNOSIS — I4729 Other ventricular tachycardia: Secondary | ICD-10-CM

## 2019-03-25 DIAGNOSIS — L905 Scar conditions and fibrosis of skin: Secondary | ICD-10-CM | POA: Diagnosis not present

## 2019-03-25 DIAGNOSIS — C44329 Squamous cell carcinoma of skin of other parts of face: Secondary | ICD-10-CM | POA: Diagnosis not present

## 2019-03-25 DIAGNOSIS — D1801 Hemangioma of skin and subcutaneous tissue: Secondary | ICD-10-CM | POA: Diagnosis not present

## 2019-03-25 DIAGNOSIS — D229 Melanocytic nevi, unspecified: Secondary | ICD-10-CM | POA: Diagnosis not present

## 2019-03-25 DIAGNOSIS — D485 Neoplasm of uncertain behavior of skin: Secondary | ICD-10-CM | POA: Diagnosis not present

## 2019-03-25 DIAGNOSIS — Z85828 Personal history of other malignant neoplasm of skin: Secondary | ICD-10-CM | POA: Diagnosis not present

## 2019-03-25 DIAGNOSIS — R9431 Abnormal electrocardiogram [ECG] [EKG]: Secondary | ICD-10-CM

## 2019-03-25 NOTE — Telephone Encounter (Signed)
John Caldwell is returning the phone call in regards to his monitor results.

## 2019-03-25 NOTE — Telephone Encounter (Signed)
Called patient with results of his monitor. Dr. Johnsie Cancel would like patient to see EP, will place referral for EP after test (ETT and echo) are complete. Mailed and e-mailed patient instructions for mychart from his AVS on 03/03/19. Will send message to EP scheduler to call patient to schedule appointment.

## 2019-04-01 ENCOUNTER — Other Ambulatory Visit (HOSPITAL_COMMUNITY): Payer: BC Managed Care – PPO

## 2019-04-05 ENCOUNTER — Other Ambulatory Visit (HOSPITAL_COMMUNITY): Payer: BC Managed Care – PPO

## 2019-04-07 NOTE — Progress Notes (Deleted)
CARDIOLOGY CONSULT NOTE       Patient ID: John Caldwell MRN: [id number] DOB/AGE: 1955/04/10 64 y.o.  Admit date: (Not on file) Referring Physician: [name] PA C Primary Physician: [name], PA-CPrimary Cardiologist: New Reason for Consultation: Palpitations / PVC;s  Active Problems:   * No active hospital problems. *   HPI:  64 y.o. history of peripheral neuropathy and skin cancer Seen by [name] in 2011 for same problem palpitations At that time thought to be related to doxycycline prescribed for tick bite In ER noted PVC;s on telemetry Holter showed infrequent PVCls and had normal TTE with no structural heart disease ECG was normal as were metabolic labs and TSH No Rx advised   He sees Dr [name] in Neurology for neuropathy in his feet Most recent labs in Epic normal TSH 09/27/18 Negative Lyme disease Normal ESR Also had normal resting ABI's 07/01/18 check due to his pedal neuropathy  Feels skips " missing beats in his chest especially at rest on left side No help with less coffee   K 4.4 Cr 0.91 Hct 41.8 TSH 0.95    From [place] has 7 kids on on spectrum of autism  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 Has border collie and is active With no symptoms of syncope, chest pain or dyspnea. No stimulants other than a couple cups of coffee  Monitor 03/24/19 15.7% total PVC burden average HR 73 Only on episode NSVT 6 beats  Echo: ETT:  ***  ROS All other systems reviewed and negative except as noted above  Past Medical History:  Diagnosis Date  . Allergic rhinitis   . Basal cell carcinoma (BCC) of chest   . ED (erectile dysfunction)   . Heart block AV first degree   . Idiopathic peripheral neuropathy   . Numbness of toes   . Palpitations   . Peripheral neuropathy 09/07/2018  . Skin cancer    of the cheek    Family History  Problem Relation Age of Onset  . Cancer Mother        Breast  . Glaucoma Mother   . Dementia Father   . Cancer Father    Prostate    Social History   Socioeconomic History  . Marital status: Married    Spouse name: Not on file  . Number of children: Not on file  . Years of education: Not on file  . Highest education level: Not on file  Occupational History  . Not on file  Tobacco Use  . Smoking status: Never Smoker  . Smokeless tobacco: Never Used  Substance and Sexual Activity  . Alcohol use: No  . Drug use: No  . Sexual activity: Not on file    Comment: married  Other Topics Concern  . Not on file  Social History Narrative   Right handed    Caffeine~ 2 cups    Social Determinants of Health   Financial Resource Strain:   . Difficulty of Paying Living Expenses:   Food Insecurity:   . Worried About Charity fundraiser in the Last Year:   . Arboriculturist in the Last Year:   Transportation Needs:   . Film/video editor (Medical):   Marland Kitchen Lack of Transportation (Non-Medical):   Physical Activity:   . Days of Exercise per Week:   . Minutes of Exercise per Session:   Stress:   . Feeling of Stress :   Social Connections:   . Frequency of  Communication with Friends and Family:   . Frequency of Social Gatherings with Friends and Family:   . Attends Religious Services:   . Active Member of Clubs or Organizations:   . Attends Archivist Meetings:   Marland Kitchen Marital Status:   Intimate Partner Violence:   . Fear of Current or Ex-Partner:   . Emotionally Abused:   Marland Kitchen Physically Abused:   . Sexually Abused:     Past Surgical History:  Procedure Laterality Date  . SKIN CANCER EXCISION  2019     No current outpatient medications on file.    Physical Exam: There were no vitals taken for this visit.   Affect appropriate Healthy:  appears stated age 81: normal Neck supple with no adenopathy JVP normal no bruits no thyromegaly Lungs clear with no wheezing and good diaphragmatic motion Heart:  S1/S2 no murmur, no rub, gallop or click PMI normal Abdomen: benighn, BS positve, no  tenderness, no AAA no bruit.  No HSM or HJR Distal pulses intact with no bruits No edema Neuro non-focal Skin warm and dry No muscular weakness   Labs:   Lab Results  Component Value Date   WBC 5.9 06/13/2009   HGB 14.2 06/13/2009   HCT 41.8 06/13/2009   MCV 92.6 06/13/2009   PLT 237 06/13/2009      Radiology: LONG TERM MONITOR (3-14 DAYS)  Result Date: 03/24/2019 NSR average HR 73 bpm PVC burden high 15.7 % total beats One episode NSVT 6 beats and periods of bigeminny PAC < 1% total beats One episode SVT 4 beats Consider EP referral given high burden of PVCls    EKG: SR trigeminy otherwise normal QT 408 msec    ASSESSMENT AND PLAN:   1. Palpitations:  History of benign PVCls dating back to 2011 at that time had normal Echo  With high PVC burden will repeat ETT and echo and refer to EP to see if suppressive Rx warranted. ? RVOT origin.   2. Neuropathy:  Labs ok f/u Dr [name]   Signed: [name] 04/07/2019, 3:41 PM

## 2019-04-08 ENCOUNTER — Other Ambulatory Visit (HOSPITAL_COMMUNITY)
Admission: RE | Admit: 2019-04-08 | Discharge: 2019-04-08 | Disposition: A | Discharge status: Home / Self Care | Payer: BC Managed Care – PPO | Source: Ambulatory Visit | Attending: Cardiovascular Disease | Admitting: Cardiovascular Disease

## 2019-04-08 DIAGNOSIS — Z20822 Contact with and (suspected) exposure to covid-19: Secondary | ICD-10-CM | POA: Insufficient documentation

## 2019-04-08 DIAGNOSIS — Z01812 Encounter for preprocedural laboratory examination: Secondary | ICD-10-CM | POA: Insufficient documentation

## 2019-04-08 LAB — SARS CORONAVIRUS 2 (TAT 6-24 HRS): SARS Coronavirus 2: NEGATIVE

## 2019-04-12 ENCOUNTER — Ambulatory Visit (INDEPENDENT_AMBULATORY_CARE_PROVIDER_SITE_OTHER): Payer: BC Managed Care – PPO

## 2019-04-12 ENCOUNTER — Other Ambulatory Visit: Payer: Self-pay

## 2019-04-12 ENCOUNTER — Ambulatory Visit (HOSPITAL_COMMUNITY): Payer: BC Managed Care – PPO | Attending: Cardiology

## 2019-04-12 DIAGNOSIS — I493 Ventricular premature depolarization: Secondary | ICD-10-CM

## 2019-04-12 LAB — EXERCISE TOLERANCE TEST
Estimated workload: 13.4 METS
Exercise duration (min): 11 min
Exercise duration (sec): 19 s
MPHR: 156 {beats}/min
Peak HR: 133 {beats}/min
Percent HR: 85 %
RPE: 17
Rest HR: 66 {beats}/min

## 2019-04-13 ENCOUNTER — Telehealth: Payer: BC Managed Care – PPO | Admitting: Cardiovascular Disease

## 2019-04-13 ENCOUNTER — Telehealth: Payer: Self-pay

## 2019-04-13 NOTE — Telephone Encounter (Signed)
-----   Message from Josue Hector, MD sent at 04/12/2019  3:47 PM EDT ----- ETT normal needs f/u with EP for his PVC;s

## 2019-04-13 NOTE — Telephone Encounter (Signed)
Left message for patient to call back  

## 2019-04-15 DIAGNOSIS — L821 Other seborrheic keratosis: Secondary | ICD-10-CM | POA: Diagnosis not present

## 2019-04-15 DIAGNOSIS — D485 Neoplasm of uncertain behavior of skin: Secondary | ICD-10-CM | POA: Diagnosis not present

## 2019-04-18 NOTE — Telephone Encounter (Signed)
Follow Up  Patient is returning call. Please give patient a call back. Patient states that if there is no answer please leave a vm with a direct phone number.

## 2019-04-18 NOTE — Telephone Encounter (Signed)
Called patient back with echo and GXT results.

## 2019-04-19 ENCOUNTER — Ambulatory Visit (INDEPENDENT_AMBULATORY_CARE_PROVIDER_SITE_OTHER): Payer: BC Managed Care – PPO | Admitting: Cardiology

## 2019-04-19 ENCOUNTER — Encounter: Payer: Self-pay | Admitting: Cardiology

## 2019-04-19 ENCOUNTER — Other Ambulatory Visit: Payer: Self-pay

## 2019-04-19 VITALS — BP 104/68 | HR 74 | Ht 73.0 in | Wt 180.0 lb

## 2019-04-19 DIAGNOSIS — I493 Ventricular premature depolarization: Secondary | ICD-10-CM

## 2019-04-19 NOTE — Patient Instructions (Signed)
Medication Instructions:  Your physician recommends that you continue on your current medications as directed. Please refer to the Current Medication list given to you today.  *If you need a refill on your cardiac medications before your next appointment, please call your pharmacy*   Lab Work: None ordered  Testing/Procedures: None ordered   Follow-Up: At [facility], you and your health needs are our priority.  As part of our continuing mission to provide you with exceptional heart care, we have created designated Provider Care Teams.  These Care Teams include your primary Cardiologist (physician) and Advanced Practice Providers (APPs -  Physician Assistants and Nurse Practitioners) who all work together to provide you with the care you need, when you need it.  We recommend signing up for the patient portal called "MyChart".  Sign up information is provided on this After Visit Summary.  MyChart is used to connect with patients for Virtual Visits (Telemedicine).  Patients are able to view lab/test results, encounter notes, upcoming appointments, etc.  Non-urgent messages can be sent to your provider as well.   To learn more about what you can do with MyChart, go to [email or web address].    Your next appointment:   6 month(s)  The format for your next appointment:   In Person  Provider:   Allegra Lai, MD   Thank you for choosing [facility]!!   Trinidad Curet, RN [phone]    Other Instructions   Cardiac Ablation Cardiac ablation is a procedure to disable (ablate) a small amount of heart tissue in very specific places. The heart has many electrical connections. Sometimes these connections are abnormal and can cause the heart to beat very fast or irregularly. Ablating some of the problem areas can improve the heart rhythm or return it to normal. Ablation may be done for people who:  Have Wolff-Parkinson-White syndrome.  Have fast heart rhythms  (tachycardia).  Have taken medicines for an abnormal heart rhythm (arrhythmia) that were not effective or caused side effects.  Have a high-risk heartbeat that may be life-threatening. During the procedure, a small incision is made in the neck or the groin, and a long, thin, flexible tube (catheter) is inserted into the incision and moved to the heart. Small devices (electrodes) on the tip of the catheter will send out electrical currents. A type of X-ray (fluoroscopy) will be used to help guide the catheter and to provide images of the heart. Tell a health care provider about:  Any allergies you have.  All medicines you are taking, including vitamins, herbs, eye drops, creams, and over-the-counter medicines.  Any problems you or family members have had with anesthetic medicines.  Any blood disorders you have.  Any surgeries you have had.  Any medical conditions you have, such as kidney failure.  Whether you are pregnant or may be pregnant. What are the risks? Generally, this is a safe procedure. However, problems may occur, including:  Infection.  Bruising and bleeding at the catheter insertion site.  Bleeding into the chest, especially into the sac that surrounds the heart. This is a serious complication.  Stroke or blood clots.  Damage to other structures or organs.  Allergic reaction to medicines or dyes.  Need for a permanent pacemaker if the normal electrical system is damaged. A pacemaker is a small computer that sends electrical signals to the heart and helps your heart beat normally.  The procedure not being fully effective. This may not be recognized until months later. Repeat ablation procedures are  sometimes required. What happens before the procedure?  Follow instructions from your health care provider about eating or drinking restrictions.  Ask your health care provider about: ? Changing or stopping your regular medicines. This is especially important if you  are taking diabetes medicines or blood thinners. ? Taking medicines such as aspirin and ibuprofen. These medicines can thin your blood. Do not take these medicines before your procedure if your health care provider instructs you not to.  Plan to have someone take you home from the hospital or clinic.  If you will be going home right after the procedure, plan to have someone with you for 24 hours. What happens during the procedure?  To lower your risk of infection: ? Your health care team will wash or sanitize their hands. ? Your skin will be washed with soap. ? Hair may be removed from the incision area.  An IV tube will be inserted into one of your veins.  You will be given a medicine to help you relax (sedative).  The skin on your neck or groin will be numbed.  An incision will be made in your neck or your groin.  A needle will be inserted through the incision and into a large vein in your neck or groin.  A catheter will be inserted into the needle and moved to your heart.  Dye may be injected through the catheter to help your surgeon see the area of the heart that needs treatment.  Electrical currents will be sent from the catheter to ablate heart tissue in desired areas. There are three types of energy that may be used to ablate heart tissue: ? Heat (radiofrequency energy). ? Laser energy. ? Extreme cold (cryoablation).  When the necessary tissue has been ablated, the catheter will be removed.  Pressure will be held on the catheter insertion area to prevent excessive bleeding.  A bandage (dressing) will be placed over the catheter insertion area. The procedure may vary among health care providers and hospitals. What happens after the procedure?  Your blood pressure, heart rate, breathing rate, and blood oxygen level will be monitored until the medicines you were given have worn off.  Your catheter insertion area will be monitored for bleeding. You will need to lie still  for a few hours to ensure that you do not bleed from the catheter insertion area.  Do not drive for 24 hours or as long as directed by your health care provider. Summary  Cardiac ablation is a procedure to disable (ablate) a small amount of heart tissue in very specific places. Ablating some of the problem areas can improve the heart rhythm or return it to normal.  During the procedure, electrical currents will be sent from the catheter to ablate heart tissue in desired areas. This information is not intended to replace advice given to you by your health care provider. Make sure you discuss any questions you have with your health care provider. Document Revised: 06/22/2017 Document Reviewed: 11/19/2015 Elsevier Patient Education  [place].   Flecainide tablets What is this medicine? FLECAINIDE (FLEK a nide) is an antiarrhythmic drug. This medicine is used to prevent irregular heart rhythm. It can also slow down fast heartbeats called tachycardia. This medicine may be used for other purposes; ask your health care provider or pharmacist if you have questions. COMMON BRAND NAME(S): Tambocor What should I tell my health care provider before I take this medicine? They need to know if you have any of these conditions:  abnormal levels of potassium in the blood  heart disease including heart rhythm and heart rate problems  kidney or liver disease  recent heart attack  an unusual or allergic reaction to flecainide, local anesthetics, other medicines, foods, dyes, or preservatives  pregnant or trying to get pregnant  breast-feeding How should I use this medicine? Take this medicine by mouth with a glass of water. Follow the directions on the prescription label. You can take this medicine with or without food. Take your doses at regular intervals. Do not take your medicine more often than directed. Do not stop taking this medicine suddenly. This may cause serious, heart-related  side effects. If your doctor wants you to stop the medicine, the dose may be slowly lowered over time to avoid any side effects. Talk to your pediatrician regarding the use of this medicine in children. While this drug may be prescribed for children as young as 1 year of age for selected conditions, precautions do apply. Overdosage: If you think you have taken too much of this medicine contact a poison control center or emergency room at once. NOTE: This medicine is only for you. Do not share this medicine with others. What if I miss a dose? If you miss a dose, take it as soon as you can. If it is almost time for your next dose, take only that dose. Do not take double or extra doses. What may interact with this medicine? Do not take this medicine with any of the following medications:  amoxapine  arsenic trioxide  certain antibiotics like clarithromycin, erythromycin, gatifloxacin, gemifloxacin, levofloxacin, moxifloxacin, sparfloxacin, or troleandomycin  certain antidepressants called tricyclic antidepressants like amitriptyline, imipramine, or nortriptyline  certain medicines to control heart rhythm like disopyramide, encainide, moricizine, procainamide, propafenone, and quinidine  cisapride  delavirdine  droperidol  haloperidol  hawthorn  imatinib  levomethadyl  maprotiline  medicines for malaria like chloroquine and halofantrine  pentamidine  phenothiazines like chlorpromazine, mesoridazine, prochlorperazine, thioridazine  pimozide  quinine  ranolazine  ritonavir  sertindole This medicine may also interact with the following medications:  cimetidine  dofetilide  medicines for angina or high blood pressure  medicines to control heart rhythm like amiodarone and digoxin  ziprasidone This list may not describe all possible interactions. Give your health care provider a list of all the medicines, herbs, non-prescription drugs, or dietary supplements you  use. Also tell them if you smoke, drink alcohol, or use illegal drugs. Some items may interact with your medicine. What should I watch for while using this medicine? Visit your doctor or health care professional for regular checks on your progress. Because your condition and the use of this medicine carries some risk, it is a good idea to carry an identification card, necklace or bracelet with details of your condition, medications and doctor or health care professional. Check your blood pressure and pulse rate regularly. Ask your health care professional what your blood pressure and pulse rate should be, and when you should contact him or her. Your doctor or health care professional also may schedule regular blood tests and electrocardiograms to check your progress. You may get drowsy or dizzy. Do not drive, use machinery, or do anything that needs mental alertness until you know how this medicine affects you. Do not stand or sit up quickly, especially if you are an older patient. This reduces the risk of dizzy or fainting spells. Alcohol can make you more dizzy, increase flushing and rapid heartbeats. Avoid alcoholic drinks. What side effects may  I notice from receiving this medicine? Side effects that you should report to your doctor or health care professional as soon as possible:  chest pain, continued irregular heartbeats  difficulty breathing  swelling of the legs or feet  trembling, shaking  unusually weak or tired Side effects that usually do not require medical attention (report to your doctor or health care professional if they continue or are bothersome):  blurred vision  constipation  headache  nausea, vomiting  stomach pain This list may not describe all possible side effects. Call your doctor for medical advice about side effects. You may report side effects to FDA at 1-800-FDA-1088. Where should I keep my medicine? Keep out of the reach of children. Store at room  temperature between 15 and 30 degrees C (59 and 86 degrees F). Protect from light. Keep container tightly closed. Throw away any unused medicine after the expiration date. NOTE: This sheet is a summary. It may not cover all possible information. If you have questions about this medicine, talk to your doctor, pharmacist, or health care provider.  2020 Elsevier/Gold Standard (2017-12-21 11:41:38)

## 2019-04-19 NOTE — Progress Notes (Signed)
Electrophysiology Office Note   Date:  04/19/2019   ID:  John Caldwell, DOB 07-25-1955, MRN [id number]  PCP:  [name], PA-C  Cardiologist:  Johnsie Cancel Primary Electrophysiologist:  Dorris Vangorder Meredith Leeds, MD    Chief Complaint: PVC   History of Present Illness: John Caldwell is a 64 y.o. male who is being seen today for the evaluation of PVC at the request of Josue Hector, MD. Presenting today for electrophysiology evaluation.  He has a history of palpitations a date back to 2011.  At the time, it was thought related to doxycycline provided for a tick bite.  He feels skips and missed beats, especially when he lays on his left side.  He has tried drinking less caffeine, but this has not changed his overall symptomatology.  He wore a cardiac monitor that showed 15% PVCs.  Today, he denies symptoms of chest pain,  orthopnea, PND, lower extremity edema, claudication, dizziness, presyncope, syncope, bleeding, or neurologic sequela. The patient is tolerating medications without difficulties.  His main symptoms are palpitations and mild shortness of breath.  He says that he can feel palpitations most times during the day.  He cut back on his caffeine, but unfortunately continued to have episodic palpitations.  Aside from that he has done well.  He continues to be able to exercise.   Past Medical History:  Diagnosis Date  . Allergic rhinitis   . Basal cell carcinoma (BCC) of chest   . ED (erectile dysfunction)   . Heart block AV first degree   . Idiopathic peripheral neuropathy   . Numbness of toes   . Palpitations   . Peripheral neuropathy 09/07/2018  . Skin cancer    of the cheek   Past Surgical History:  Procedure Laterality Date  . SKIN CANCER EXCISION  2019     No current outpatient medications on file.   No current facility-administered medications for this visit.    Allergies:   Patient has no known allergies.   Social History:  The patient  reports that he has never  smoked. He has never used smokeless tobacco. He reports that he does not drink alcohol or use drugs.   Family History:  The patient's family history includes Cancer in his father and mother; Dementia in his father; Glaucoma in his mother.    ROS:  Please see the history of present illness.   Otherwise, review of systems is positive for none.   All other systems are reviewed and negative.    PHYSICAL EXAM: VS:  BP 104/68   Pulse 74   Ht 6\' 1"  (1.854 m)   Wt 180 lb (81.6 kg)   SpO2 96%   BMI 23.75 kg/m  , BMI Body mass index is 23.75 kg/m. GEN: Well nourished, well developed, in no acute distress  HEENT: normal  Neck: no JVD, carotid bruits, or masses Cardiac: RRR; no murmurs, rubs, or gallops,no edema  Respiratory:  clear to auscultation bilaterally, normal work of breathing GI: soft, nontender, nondistended, + BS MS: no deformity or atrophy  Skin: warm and dry Neuro:  Strength and sensation are intact Psych: euthymic mood, full affect  EKG:  EKG is ordered today. Personal review of the ekg ordered shows, PVCs, rate 74  Recent Labs: 09/27/2018: TSH 1.230    Lipid Panel  No results found for: CHOL, TRIG, HDL, CHOLHDL, VLDL, LDLCALC, LDLDIRECT   Wt Readings from Last 3 Encounters:  04/19/19 180 lb (81.6 kg)  03/03/19 173  lb (78.5 kg)  09/27/18 179 lb (81.2 kg)      Other studies Reviewed: Additional studies/ records that were reviewed today include: TTE 04/12/19  Review of the above records today demonstrates:  1. Left ventricular ejection fraction, by estimation, is 60 to 65%. The  left ventricle has normal function. The left ventricle has no regional  wall motion abnormalities. There is mild left ventricular hypertrophy.  Left ventricular diastolic parameters  were normal.  2. Right ventricular systolic function is normal. The right ventricular  size is normal. Tricuspid regurgitation signal is inadequate for assessing  PA pressure.  3. Left atrial size was  mildly dilated.  4. The mitral valve is normal in structure. Trivial mitral valve  regurgitation.  5. The aortic valve is tricuspid. Aortic valve regurgitation is not  visualized. Mild to moderate aortic valve sclerosis/calcification is  present, without any evidence of aortic stenosis.  6. The inferior vena cava is dilated in size with >50% respiratory  variability, suggesting right atrial pressure of 8 mmHg.   Cardiac monitor 03/24/2019 personally reviewed NSR average HR 73 bpm PVC burden high 15.7 % total beats One episode NSVT 6 beats and periods of bigeminny PAC < 1% total beats One episode SVT 4 beats  ETT 04/12/2019 Negative adequate stress test for ischemia. Frequent PVCs, including ventricular bigeminy, ventricular trigeminy, and couplets. PVC burden diminished at peak exercise and returned in recovery.   ASSESSMENT AND PLAN:  1.  PVCs: 15% on cardiac monitor.  He has symptoms of palpitations and shortness of breath during elevated PVC burden's.  He says that he can check his pulse and has evidence of bigeminy.  Fortunately, his ejection fraction has remained normal.  He would like to consider further options.  We did discuss initiation of flecainide as well as ablation.  He Aislinn Feliz call us back with answer.  Otherwise we Laranda Burkemper see him back in 6 months.  Referring cardiologist  Current medicines are reviewed at length with the patient today.   The patient does not have concerns regarding his medicines.  The following changes were made today:  none  Labs/ tests ordered today include:  Orders Placed This Encounter  Procedures  . EKG 12-Lead     Disposition:   FU with Sabine Tenenbaum 6 months  Signed, Kaevion Sinclair Meredith Leeds, MD  04/19/2019 9:43 AM     [facility] [address] [place] [address] [phone] (office) [phone] (fax)

## 2019-05-28 DIAGNOSIS — Z03818 Encounter for observation for suspected exposure to other biological agents ruled out: Secondary | ICD-10-CM | POA: Diagnosis not present

## 2019-05-28 DIAGNOSIS — Z20828 Contact with and (suspected) exposure to other viral communicable diseases: Secondary | ICD-10-CM | POA: Diagnosis not present

## 2019-06-22 DIAGNOSIS — Z131 Encounter for screening for diabetes mellitus: Secondary | ICD-10-CM | POA: Diagnosis not present

## 2019-06-22 DIAGNOSIS — Z136 Encounter for screening for cardiovascular disorders: Secondary | ICD-10-CM | POA: Diagnosis not present

## 2019-06-22 DIAGNOSIS — Z125 Encounter for screening for malignant neoplasm of prostate: Secondary | ICD-10-CM | POA: Diagnosis not present

## 2019-06-22 DIAGNOSIS — Z0000 Encounter for general adult medical examination without abnormal findings: Secondary | ICD-10-CM | POA: Diagnosis not present

## 2019-09-27 ENCOUNTER — Encounter (INDEPENDENT_AMBULATORY_CARE_PROVIDER_SITE_OTHER): Payer: Self-pay

## 2019-09-27 ENCOUNTER — Telehealth: Payer: Self-pay | Admitting: Cardiology

## 2019-09-27 DIAGNOSIS — I493 Ventricular premature depolarization: Secondary | ICD-10-CM

## 2019-09-27 NOTE — Telephone Encounter (Signed)
Patient is requesting to wear a Zio monitor prior to appointment scheduled for 01/19/19 with Dr. Curt Bears.

## 2019-11-08 ENCOUNTER — Ambulatory Visit: Payer: BC Managed Care – PPO | Admitting: Cardiology

## 2019-11-08 ENCOUNTER — Telehealth: Payer: Self-pay | Admitting: Radiology

## 2019-11-08 NOTE — Telephone Encounter (Signed)
Spoke to pt  -- advised that Dr. Curt Bears would like to have monitor before a follow up (pt appt was moved up to today from January).  Pt is agreeable.  Aware I will place orders, send instructions via mychart and mkonitor to be mailed. Pt aware office will contact him to reschedule f/u w/ Camnitz after monitor has been completed.

## 2019-11-08 NOTE — Telephone Encounter (Signed)
Enrolled patient for a 3 day Zio XT monitor to be mailed to patients home  

## 2019-11-08 NOTE — Progress Notes (Deleted)
Electrophysiology Office Note   Date:  11/08/2019   ID:  ZEYAD DELAGUILA, DOB 12-30-1955, MRN [id number]  PCP:  Garth Bigness (Inactive)  Cardiologist:  Johnsie Cancel Primary Electrophysiologist:  Alveria Mcglaughlin Meredith Leeds, MD    Chief Complaint: PVC   History of Present Illness: John Caldwell is a 64 y.o. male who is being seen today for the evaluation of PVC at the request of No ref. provider found. Presenting today for electrophysiology evaluation.  He has a history of palpitations that date back to 2011.  At the time he was thought it is related to doxycycline that he took for a tick bite.  He feels skips and missed beats.  This occurs often when he lays on his left side.  He has tried to drink less caffeine but this did not change his overall symptomatology.  He wore a cardiac monitor that showed up to 15% PVCs.  Today, denies symptoms of palpitations, chest pain, shortness of breath, orthopnea, PND, lower extremity edema, claudication, dizziness, presyncope, syncope, bleeding, or neurologic sequela. The patient is tolerating medications without difficulties. ***    Past Medical History:  Diagnosis Date  . Allergic rhinitis   . Basal cell carcinoma (BCC) of chest   . ED (erectile dysfunction)   . Heart block AV first degree   . Idiopathic peripheral neuropathy   . Numbness of toes   . Palpitations   . Peripheral neuropathy 09/07/2018  . Skin cancer    of the cheek   Past Surgical History:  Procedure Laterality Date  . SKIN CANCER EXCISION  2019     No current outpatient medications on file.   No current facility-administered medications for this visit.    Allergies:   Patient has no known allergies.   Social History:  The patient  reports that he has never smoked. He has never used smokeless tobacco. He reports that he does not drink alcohol and does not use drugs.   Family History:  The patient's family history includes Cancer in his father and mother; Dementia in his  father; Glaucoma in his mother.   ROS:  Please see the history of present illness.   Otherwise, review of systems is positive for none.   All other systems are reviewed and negative.   PHYSICAL EXAM: VS:  There were no vitals taken for this visit. , BMI There is no height or weight on file to calculate BMI. GEN: Well nourished, well developed, in no acute distress  HEENT: normal  Neck: no JVD, carotid bruits, or masses Cardiac: ***RRR; no murmurs, rubs, or gallops,no edema  Respiratory:  clear to auscultation bilaterally, normal work of breathing GI: soft, nontender, nondistended, + BS MS: no deformity or atrophy  Skin: warm and dry Neuro:  Strength and sensation are intact Psych: euthymic mood, full affect  EKG:  EKG {ACTION; IS/IS [id number]} ordered today. Personal review of the ekg ordered *** shows ***   Recent Labs: No results found for requested labs within last 8760 hours.    Lipid Panel  No results found for: CHOL, TRIG, HDL, CHOLHDL, VLDL, LDLCALC, LDLDIRECT   Wt Readings from Last 3 Encounters:  04/19/19 180 lb (81.6 kg)  03/03/19 173 lb (78.5 kg)  09/27/18 179 lb (81.2 kg)      Other studies Reviewed: Additional studies/ records that were reviewed today include: TTE 04/12/19  Review of the above records today demonstrates:  1. Left ventricular ejection fraction, by estimation, is 60 to 65%.  The  left ventricle has normal function. The left ventricle has no regional  wall motion abnormalities. There is mild left ventricular hypertrophy.  Left ventricular diastolic parameters  were normal.  2. Right ventricular systolic function is normal. The right ventricular  size is normal. Tricuspid regurgitation signal is inadequate for assessing  PA pressure.  3. Left atrial size was mildly dilated.  4. The mitral valve is normal in structure. Trivial mitral valve  regurgitation.  5. The aortic valve is tricuspid. Aortic valve regurgitation is not  visualized.  Mild to moderate aortic valve sclerosis/calcification is  present, without any evidence of aortic stenosis.  6. The inferior vena cava is dilated in size with >50% respiratory  variability, suggesting right atrial pressure of 8 mmHg.   Cardiac monitor 03/24/2019 personally reviewed NSR average HR 73 bpm PVC burden high 15.7 % total beats One episode NSVT 6 beats and periods of bigeminny PAC < 1% total beats One episode SVT 4 beats  ETT 04/12/2019 Negative adequate stress test for ischemia. Frequent PVCs, including ventricular bigeminy, ventricular trigeminy, and couplets. PVC burden diminished at peak exercise and returned in recovery.   ASSESSMENT AND PLAN:  1.  PVCs: 15% on cardiac monitor.  He has symptoms of shortness of breath during elevated PVC burden.***   Current medicines are reviewed at length with the patient today.   The patient does not have concerns regarding his medicines.  The following changes were made today:  ***  Labs/ tests ordered today include:  No orders of the defined types were placed in this encounter.    Disposition:   FU with Shawan Corella *** months  Signed, Deeya Richeson Meredith Leeds, MD  11/08/2019 11:13 AM     [facility] [address] [place] [address] [phone] (office) [phone] (fax)

## 2019-11-13 ENCOUNTER — Other Ambulatory Visit (INDEPENDENT_AMBULATORY_CARE_PROVIDER_SITE_OTHER): Payer: Self-pay

## 2019-11-13 DIAGNOSIS — I493 Ventricular premature depolarization: Secondary | ICD-10-CM

## 2019-12-13 ENCOUNTER — Other Ambulatory Visit: Payer: Self-pay

## 2019-12-13 ENCOUNTER — Encounter: Payer: Self-pay | Admitting: Cardiology

## 2019-12-13 ENCOUNTER — Ambulatory Visit (INDEPENDENT_AMBULATORY_CARE_PROVIDER_SITE_OTHER): Payer: Self-pay | Admitting: Cardiology

## 2019-12-13 VITALS — BP 130/70 | HR 65 | Ht 73.0 in | Wt 182.0 lb

## 2019-12-13 DIAGNOSIS — I493 Ventricular premature depolarization: Secondary | ICD-10-CM

## 2019-12-13 NOTE — Progress Notes (Signed)
Electrophysiology Office Note   Date:  12/13/2019   ID:  John Caldwell, DOB January 05, 1956, MRN [id number]  PCP:  Carolee Rota, NP  Cardiologist:  Johnsie Cancel Primary Electrophysiologist:  Aylanie Cubillos Meredith Leeds, MD    Chief Complaint: PVC   History of Present Illness: John Caldwell is a 64 y.o. male who is being seen today for the evaluation of PVC at the request of No ref. provider found. Presenting today for electrophysiology evaluation.  He has a history of palpitations that date back to 2011.  At that time it was thought due to doxycycline for a tick bite.  He feels skips and missed any especially when he lays on his left side.  He tried drinking caffeine but this has not changed.  He wore a cardiac monitor that showed A. fib 15% PVC burden.  Repeat monitor showed a 25% burden.  Today, denies symptoms of , chest pain, shortness of breath, orthopnea, PND, lower extremity edema, claudication, dizziness, presyncope, syncope, bleeding, or neurologic sequela. The patient is tolerating medications without difficulties.  He continues to have episodic palpitations.  His palpitations bother him most when he is lying on his left side or at rest.  He is mostly unaware of them during the day.   Past Medical History:  Diagnosis Date  . Allergic rhinitis   . Basal cell carcinoma (BCC) of chest   . ED (erectile dysfunction)   . Heart block AV first degree   . Idiopathic peripheral neuropathy   . Numbness of toes   . Palpitations   . Peripheral neuropathy 09/07/2018  . Skin cancer    of the cheek   Past Surgical History:  Procedure Laterality Date  . SKIN CANCER EXCISION  2019     No current outpatient medications on file.   No current facility-administered medications for this visit.    Allergies:   Patient has no known allergies.   Social History:  The patient  reports that he has never smoked. He has never used smokeless tobacco. He reports that he does not drink alcohol and does not use  drugs.   Family History:  The patient's family history includes Cancer in his father and mother; Dementia in his father; Glaucoma in his mother.   ROS:  Please see the history of present illness.   Otherwise, review of systems is positive for none.   All other systems are reviewed and negative.   PHYSICAL EXAM: VS:  BP 130/70   Pulse 65   Ht 6\' 1"  (1.854 m)   Wt 182 lb (82.6 kg)   SpO2 97%   BMI 24.01 kg/m  , BMI Body mass index is 24.01 kg/m. GEN: Well nourished, well developed, in no acute distress  HEENT: normal  Neck: no JVD, carotid bruits, or masses Cardiac: RRR; no murmurs, rubs, or gallops,no edema  Respiratory:  clear to auscultation bilaterally, normal work of breathing GI: soft, nontender, nondistended, + BS MS: no deformity or atrophy  Skin: warm and dry Neuro:  Strength and sensation are intact Psych: euthymic mood, full affect  EKG:  EKG is ordered today. Personal review of the ekg ordered shows sinus rhythm, PVCs  Recent Labs: No results found for requested labs within last 8760 hours.    Lipid Panel  No results found for: CHOL, TRIG, HDL, CHOLHDL, VLDL, LDLCALC, LDLDIRECT   Wt Readings from Last 3 Encounters:  12/13/19 182 lb (82.6 kg)  04/19/19 180 lb (81.6 kg)  03/03/19 173 lb (78.5  kg)      Other studies Reviewed: Additional studies/ records that were reviewed today include: TTE 04/12/19  Review of the above records today demonstrates:  1. Left ventricular ejection fraction, by estimation, is 60 to 65%. The  left ventricle has normal function. The left ventricle has no regional  wall motion abnormalities. There is mild left ventricular hypertrophy.  Left ventricular diastolic parameters  were normal.  2. Right ventricular systolic function is normal. The right ventricular  size is normal. Tricuspid regurgitation signal is inadequate for assessing  PA pressure.  3. Left atrial size was mildly dilated.  4. The mitral valve is normal in  structure. Trivial mitral valve  regurgitation.  5. The aortic valve is tricuspid. Aortic valve regurgitation is not  visualized. Mild to moderate aortic valve sclerosis/calcification is  present, without any evidence of aortic stenosis.  6. The inferior vena cava is dilated in size with >50% respiratory  variability, suggesting right atrial pressure of 8 mmHg.   Cardiac monitor 11/27/2019 personally reviewed Max 167 bpm 07:04pm, 11/03 Min 53 bpm 02:37am, 11/02 Avg 74 bpm <1% supraventricular ectopy 26.4% ventricular ectopy Predominant underlying rhythm was sinus rhythm Triggered events associated with PVCs   ASSESSMENT AND PLAN:  1.  PVCs: 15% on cardiac monitor.  As symptoms of palpitations and shortness of breath during elevated PVC burden.  Ejection fraction has remained normal.  Repeat monitor showed a burden of 25%.  At this point he would like to avoid medications or procedure.  We Tauheedah Bok plan to repeat his echo in March of this year to ensure that his ejection fraction has remained stable.  Current medicines are reviewed at length with the patient today.   The patient does not have concerns regarding his medicines.  The following changes were made today:  none  Labs/ tests ordered today include:  Orders Placed This Encounter  Procedures  . EKG 12-Lead  . ECHOCARDIOGRAM COMPLETE     Disposition:   FU with Milea Klink 6 months  Signed, Shanel Prazak Meredith Leeds, MD  12/13/2019 4:37 PM     [place] [address] [place] [address] [phone] (office) [phone] (fax)

## 2019-12-13 NOTE — Patient Instructions (Signed)
Medication Instructions:  Your physician recommends that you continue on your current medications as directed. Please refer to the Current Medication list given to you today.  *If you need a refill on your cardiac medications before your next appointment, please call your pharmacy*   Lab Work: None ordered   Testing/Procedures: Your physician has requested that you have an echocardiogram in March 2022. Echocardiography is a painless test that uses sound waves to create images of your heart. It provides your doctor with information about the size and shape of your heart and how well your heart's chambers and valves are working. This procedure takes approximately one hour. There are no restrictions for this procedure.   Follow-Up: At [facility], you and your health needs are our priority.  As part of our continuing mission to provide you with exceptional heart care, we have created designated Provider Care Teams.  These Care Teams include your primary Cardiologist (physician) and Advanced Practice Providers (APPs -  Physician Assistants and Nurse Practitioners) who all work together to provide you with the care you need, when you need it.  Your next appointment:   after your echocardiogram  The format for your next appointment:   In Person  Provider:   Allegra Lai, MD    Thank you for choosing [facility]!!   Trinidad Curet, RN [phone]

## 2020-01-19 ENCOUNTER — Ambulatory Visit: Payer: BC Managed Care – PPO | Admitting: Cardiology

## 2020-04-09 ENCOUNTER — Other Ambulatory Visit (HOSPITAL_COMMUNITY): Payer: Self-pay

## 2020-04-18 ENCOUNTER — Ambulatory Visit (HOSPITAL_COMMUNITY): Payer: Medicare Other | Attending: Cardiology

## 2020-04-18 ENCOUNTER — Other Ambulatory Visit: Payer: Self-pay

## 2020-04-18 DIAGNOSIS — I358 Other nonrheumatic aortic valve disorders: Secondary | ICD-10-CM

## 2020-04-18 DIAGNOSIS — R002 Palpitations: Secondary | ICD-10-CM | POA: Diagnosis not present

## 2020-04-18 DIAGNOSIS — I493 Ventricular premature depolarization: Secondary | ICD-10-CM | POA: Insufficient documentation

## 2020-04-18 DIAGNOSIS — I440 Atrioventricular block, first degree: Secondary | ICD-10-CM | POA: Diagnosis not present

## 2020-04-18 DIAGNOSIS — I340 Nonrheumatic mitral (valve) insufficiency: Secondary | ICD-10-CM | POA: Diagnosis not present

## 2020-04-18 LAB — ECHOCARDIOGRAM COMPLETE
Area-P 1/2: 3.37 cm2
S' Lateral: 3.2 cm

## 2020-04-19 ENCOUNTER — Ambulatory Visit (INDEPENDENT_AMBULATORY_CARE_PROVIDER_SITE_OTHER): Payer: Medicare Other | Admitting: Cardiology

## 2020-04-19 ENCOUNTER — Encounter: Payer: Self-pay | Admitting: Cardiology

## 2020-04-19 VITALS — BP 98/64 | HR 66 | Ht 73.0 in | Wt 179.4 lb

## 2020-04-19 DIAGNOSIS — I493 Ventricular premature depolarization: Secondary | ICD-10-CM | POA: Diagnosis not present

## 2020-04-19 NOTE — Progress Notes (Signed)
Electrophysiology Office Note   Date:  04/19/2020   ID:  John Caldwell, DOB 31-Mar-1955, MRN [id number]  PCP:  Carolee Rota, NP  Cardiologist:  Johnsie Cancel Primary Electrophysiologist:  [place] Meredith Leeds, MD    Chief Complaint: PVC   History of Present Illness: John Caldwell is a 65 y.o. male who is being seen today for the evaluation of PVC at the request of Carolee Rota, NP. Presenting today for electrophysiology evaluation.  He has a history of palpitations a date back to 2011.  At that time it was thought due to doxycycline for a tick bite.  He wore a cardiac monitor that showed a PVC burden of 15%.  Repeat monitor showed a 25% burden.  Today, denies symptoms of palpitations, chest pain, shortness of breath, orthopnea, PND, lower extremity edema, claudication, dizziness, presyncope, syncope, bleeding, or neurologic sequela. The patient is tolerating medications without difficulties.  Since last being seen he has done well.  He has no chest pain or shortness of breath few weeks ago, he was able to go to [place] to do some high elevation hiking.  He had no issues with this.  He is aware of his PVCs, feeling palpitations more at night.  Past Medical History:  Diagnosis Date  . Allergic rhinitis   . Basal cell carcinoma (BCC) of chest   . ED (erectile dysfunction)   . Heart block AV first degree   . Idiopathic peripheral neuropathy   . Numbness of toes   . Palpitations   . Peripheral neuropathy 09/07/2018  . Skin cancer    of the cheek   Past Surgical History:  Procedure Laterality Date  . SKIN CANCER EXCISION  2019     No current outpatient medications on file.   No current facility-administered medications for this visit.    Allergies:   Patient has no known allergies.   Social History:  The patient  reports that he has never smoked. He has never used smokeless tobacco. He reports that he does not drink alcohol and does not use drugs.   Family History:  The patient's  family history includes Cancer in his father and mother; Dementia in his father; Glaucoma in his mother.   ROS:  Please see the history of present illness.   Otherwise, review of systems is positive for none.   All other systems are reviewed and negative.   PHYSICAL EXAM: VS:  BP 98/64   Pulse 66   Ht 6\' 1"  (1.854 m)   Wt 179 lb 6.4 oz (81.4 kg)   SpO2 96%   BMI 23.67 kg/m  , BMI Body mass index is 23.67 kg/m. GEN: Well nourished, well developed, in no acute distress  HEENT: normal  Neck: no JVD, carotid bruits, or masses Cardiac: RRR; no murmurs, rubs, or gallops,no edema  Respiratory:  clear to auscultation bilaterally, normal work of breathing GI: soft, nontender, nondistended, + BS MS: no deformity or atrophy  Skin: warm and dry Neuro:  Strength and sensation are intact Psych: euthymic mood, full affect  EKG:  EKG is ordered today. Personal review of the ekg ordered shows sinus rhythm, PVCs  Recent Labs: No results found for requested labs within last 8760 hours.    Lipid Panel  No results found for: CHOL, TRIG, HDL, CHOLHDL, VLDL, LDLCALC, LDLDIRECT   Wt Readings from Last 3 Encounters:  04/19/20 179 lb 6.4 oz (81.4 kg)  12/13/19 182 lb (82.6 kg)  04/19/19 180 lb (81.6 kg)  Other studies Reviewed: Additional studies/ records that were reviewed today include: TTE 04/18/2020 Review of the above records today demonstrates:  1. Left ventricular ejection fraction, by estimation, is 55 to 60%. The  left ventricle has normal function. The left ventricle demonstrates  regional wall motion abnormalities (see scoring diagram/findings for  description). Left ventricular diastolic  parameters are consistent with Grade I diastolic dysfunction (impaired  relaxation). There is mild hypokinesis of the left ventricular, basal  inferior wall.  2. Right ventricular systolic function is normal. The right ventricular  size is normal. Tricuspid regurgitation signal is  inadequate for assessing  PA pressure.  3. The mitral valve is normal in structure. Mild mitral valve  regurgitation. No evidence of mitral stenosis.  4. The aortic valve is tricuspid. Aortic valve regurgitation is not  visualized. Mild to moderate aortic valve sclerosis/calcification is  present, without any evidence of aortic stenosis.  5. The inferior vena cava is normal in size with greater than 50%  respiratory variability, suggesting right atrial pressure of 3 mmHg.   Cardiac monitor 11/27/2019 personally reviewed Max 167 bpm 07:04pm, 11/03 Min 53 bpm 02:37am, 11/02 Avg 74 bpm <1% supraventricular ectopy 26.4% ventricular ectopy Predominant underlying rhythm was sinus rhythm Triggered events associated with PVCs   ASSESSMENT AND PLAN:  1.  PVCs: 15-25 % on cardiac monitor.  He continues to have PVCs.  Despite that, his ejection fraction has remained stable.  His burden appears to be unchanged from previous.  He does not complain of shortness of breath or fatigue.  Due to that, we John Caldwell continue with current management.  He would like to avoid procedures or medications at this time.  Current medicines are reviewed at length with the patient today.   The patient does not have concerns regarding his medicines.  The following changes were made today: None  Labs/ tests ordered today include:  Orders Placed This Encounter  Procedures  . EKG 12-Lead     Disposition:   FU with John Caldwell 6 months  Signed, John Caldwell Meredith Leeds, MD  04/19/2020 11:38 AM     [facility] [address] [place] [address] [phone] (office) [phone] (fax)

## 2020-04-19 NOTE — Patient Instructions (Addendum)
Medication Instructions:  Your physician recommends that you continue on your current medications as directed. Please refer to the Current Medication list given to you today.  Labwork: None ordered.  Testing/Procedures: None ordered.  Follow-Up: Your physician wants you to follow-up in: 10/11/20 at 11:30 am with Allegra Lai, MD    Any Other Special Instructions Will Be Listed Below (If Applicable).  If you need a refill on your cardiac medications before your next appointment, please call your pharmacy.

## 2020-10-11 ENCOUNTER — Ambulatory Visit: Payer: Medicare Other | Admitting: Cardiology

## 2020-10-22 ENCOUNTER — Ambulatory Visit (INDEPENDENT_AMBULATORY_CARE_PROVIDER_SITE_OTHER): Payer: Medicare Other | Admitting: Cardiology

## 2020-10-22 ENCOUNTER — Encounter: Payer: Self-pay | Admitting: Cardiology

## 2020-10-22 ENCOUNTER — Other Ambulatory Visit: Payer: Self-pay

## 2020-10-22 VITALS — BP 102/68 | HR 62 | Ht 73.0 in | Wt 178.0 lb

## 2020-10-22 DIAGNOSIS — I493 Ventricular premature depolarization: Secondary | ICD-10-CM

## 2020-10-22 NOTE — Progress Notes (Signed)
Electrophysiology Office Note   Date:  10/22/2020   ID:  John Caldwell, DOB 1955/05/06, MRN [id number]  PCP:  Carolee Rota, NP  Cardiologist:  Johnsie Cancel Primary Electrophysiologist:  Shaquela Weichert Meredith Leeds, MD    Chief Complaint: PVC   History of Present Illness: John Caldwell is a 65 y.o. male who is being seen today for the evaluation of PVC at the request of Carolee Rota, NP. Presenting today for electrophysiology evaluation.  He has a history significant for palpitations that date back to 2011.  At that time it was thought it was due to doxycycline.  He then wore a cardiac monitor that showed a 15% PVC burden with a repeat monitor showing a 25% burden.  Today, denies symptoms of chest pain, shortness of breath, orthopnea, PND, lower extremity edema, claudication, dizziness, presyncope, syncope, bleeding, or neurologic sequela. The patient is tolerating medications without difficulties.  He has been having more frequent palpitations as well as potentially some dyspnea on exertion.  He is able to ride his bike and is able to hike in [place], though he is unable to keep up with the people that he is exercising less.  He is also able to play tennis.  He notices palpitations mainly when he is sleeping at night and laying on his left side.  At this point, he is tired of symptoms and would prefer further therapy.  Past Medical History:  Diagnosis Date   Allergic rhinitis    Basal cell carcinoma (BCC) of chest    ED (erectile dysfunction)    Heart block AV first degree    Idiopathic peripheral neuropathy    Numbness of toes    Palpitations    Peripheral neuropathy 09/07/2018   Skin cancer    of the cheek   Past Surgical History:  Procedure Laterality Date   SKIN CANCER EXCISION  2019     No current outpatient medications on file.   No current facility-administered medications for this visit.    Allergies:   Patient has no known allergies.   Social History:  The patient  reports  that he has never smoked. He has never used smokeless tobacco. He reports that he does not drink alcohol and does not use drugs.   Family History:  The patient's family history includes Cancer in his father and mother; Dementia in his father; Glaucoma in his mother.   ROS:  Please see the history of present illness.   Otherwise, review of systems is positive for none.   All other systems are reviewed and negative.   PHYSICAL EXAM: VS:  BP 102/68   Pulse 62   Ht 6\' 1"  (1.854 m)   Wt 178 lb (80.7 kg)   SpO2 97%   BMI 23.48 kg/m  , BMI Body mass index is 23.48 kg/m. GEN: Well nourished, well developed, in no acute distress  HEENT: normal  Neck: no JVD, carotid bruits, or masses Cardiac: RRR; no murmurs, rubs, or gallops,no edema  Respiratory:  clear to auscultation bilaterally, normal work of breathing GI: soft, nontender, nondistended, + BS MS: no deformity or atrophy  Skin: warm and dry Neuro:  Strength and sensation are intact Psych: euthymic mood, full affect  EKG:  EKG is ordered today. Personal review of the ekg ordered shows Sinus Rhythm, PVCs  Recent Labs: No results found for requested labs within last 8760 hours.    Lipid Panel  No results found for: CHOL, TRIG, HDL, CHOLHDL, VLDL, LDLCALC, LDLDIRECT  Wt Readings from Last 3 Encounters:  10/22/20 178 lb (80.7 kg)  04/19/20 179 lb 6.4 oz (81.4 kg)  12/13/19 182 lb (82.6 kg)      Other studies Reviewed: Additional studies/ records that were reviewed today include: TTE 04/18/2020 Review of the above records today demonstrates:   1. Left ventricular ejection fraction, by estimation, is 55 to 60%. The  left ventricle has normal function. The left ventricle demonstrates  regional wall motion abnormalities (see scoring diagram/findings for  description). Left ventricular diastolic  parameters are consistent with Grade I diastolic dysfunction (impaired  relaxation). There is mild hypokinesis of the left ventricular,  basal  inferior wall.   2. Right ventricular systolic function is normal. The right ventricular  size is normal. Tricuspid regurgitation signal is inadequate for assessing  PA pressure.   3. The mitral valve is normal in structure. Mild mitral valve  regurgitation. No evidence of mitral stenosis.   4. The aortic valve is tricuspid. Aortic valve regurgitation is not  visualized. Mild to moderate aortic valve sclerosis/calcification is  present, without any evidence of aortic stenosis.   5. The inferior vena cava is normal in size with greater than 50%  respiratory variability, suggesting right atrial pressure of 3 mmHg.   Cardiac monitor 11/27/2019 personally reviewed Max 167 bpm 07:04pm, 11/03 Min 53 bpm 02:37am, 11/02 Avg 74 bpm <1% supraventricular ectopy 26.4% ventricular ectopy Predominant underlying rhythm was sinus rhythm Triggered events associated with PVCs   ASSESSMENT AND PLAN:  1.  PVCs: 15 to 25% on cardiac monitor.  He continues to have PVCs and his ejection fraction has remained stable.  Burden is unchanged from prior.  Unfortunately, he has developed some mild shortness of breath as well as palpitations.  He would like further therapy for his PVCs.  PVCs appear to be outflow tract related, potentially RVOT.  Due to that, we Deaglan Lile plan for ablation.  Risk and benefits and discussed.  Risk include bleeding, infection, tamponade, pneumothorax damage to surrounding organs, kidney damage, death, among others.  He understands these risks and has agreed to the procedure.  Current medicines are reviewed at length with the patient today.   The patient does not have concerns regarding his medicines.  The following changes were made today: None  Labs/ tests ordered today include:  Orders Placed This Encounter  Procedures   EKG 12-Lead      Disposition:   FU with Stephanie Mcglone 3 months  Signed, Chapel Silverthorn Meredith Leeds, MD  10/22/2020 4:53 PM     [address] [place] [address] [phone] (office) [phone] (fax)

## 2020-10-22 NOTE — H&P (View-Only) (Signed)
Electrophysiology Office Note   Date:  10/22/2020   ID:  John Caldwell, DOB 1956/01/08, MRN [id number]  PCP:  Carolee Rota, NP  Cardiologist:  Johnsie Cancel Primary Electrophysiologist:  John Heart Meredith Leeds, MD    Chief Complaint: PVC   History of Present Illness: John Caldwell is a 65 y.o. male who is being seen today for the evaluation of PVC at the request of Carolee Rota, NP. Presenting today for electrophysiology evaluation.  He has a history significant for palpitations that date back to 2011.  At that time it was thought it was due to doxycycline.  He then wore a cardiac monitor that showed a 15% PVC burden with a repeat monitor showing a 25% burden.  Today, denies symptoms of chest pain, shortness of breath, orthopnea, PND, lower extremity edema, claudication, dizziness, presyncope, syncope, bleeding, or neurologic sequela. The patient is tolerating medications without difficulties.  He has been having more frequent palpitations as well as potentially some dyspnea on exertion.  He is able to ride his bike and is able to hike in [place], though he is unable to keep up with the people that he is exercising less.  He is also able to play tennis.  He notices palpitations mainly when he is sleeping at night and laying on his left side.  At this point, he is tired of symptoms and would prefer further therapy.  Past Medical History:  Diagnosis Date   Allergic rhinitis    Basal cell carcinoma (BCC) of chest    ED (erectile dysfunction)    Heart block AV first degree    Idiopathic peripheral neuropathy    Numbness of toes    Palpitations    Peripheral neuropathy 09/07/2018   Skin cancer    of the cheek   Past Surgical History:  Procedure Laterality Date   SKIN CANCER EXCISION  2019     No current outpatient medications on file.   No current facility-administered medications for this visit.    Allergies:   Patient has no known allergies.   Social History:  The patient  reports  that he has never smoked. He has never used smokeless tobacco. He reports that he does not drink alcohol and does not use drugs.   Family History:  The patient's family history includes Cancer in his father and mother; Dementia in his father; Glaucoma in his mother.   ROS:  Please see the history of present illness.   Otherwise, review of systems is positive for none.   All other systems are reviewed and negative.   PHYSICAL EXAM: VS:  BP 102/68   Pulse 62   Ht 6\' 1"  (1.854 m)   Wt 178 lb (80.7 kg)   SpO2 97%   BMI 23.48 kg/m  , BMI Body mass index is 23.48 kg/m. GEN: Well nourished, well developed, in no acute distress  HEENT: normal  Neck: no JVD, carotid bruits, or masses Cardiac: RRR; no murmurs, rubs, or gallops,no edema  Respiratory:  clear to auscultation bilaterally, normal work of breathing GI: soft, nontender, nondistended, + BS MS: no deformity or atrophy  Skin: warm and dry Neuro:  Strength and sensation are intact Psych: euthymic mood, full affect  EKG:  EKG is ordered today. Personal review of the ekg ordered shows Sinus Rhythm, PVCs  Recent Labs: No results found for requested labs within last 8760 hours.    Lipid Panel  No results found for: CHOL, TRIG, HDL, CHOLHDL, VLDL, LDLCALC, LDLDIRECT  Wt Readings from Last 3 Encounters:  10/22/20 178 lb (80.7 kg)  04/19/20 179 lb 6.4 oz (81.4 kg)  12/13/19 182 lb (82.6 kg)      Other studies Reviewed: Additional studies/ records that were reviewed today include: TTE 04/18/2020 Review of the above records today demonstrates:   1. Left ventricular ejection fraction, by estimation, is 55 to 60%. The  left ventricle has normal function. The left ventricle demonstrates  regional wall motion abnormalities (see scoring diagram/findings for  description). Left ventricular diastolic  parameters are consistent with Grade I diastolic dysfunction (impaired  relaxation). There is mild hypokinesis of the left ventricular,  basal  inferior wall.   2. Right ventricular systolic function is normal. The right ventricular  size is normal. Tricuspid regurgitation signal is inadequate for assessing  PA pressure.   3. The mitral valve is normal in structure. Mild mitral valve  regurgitation. No evidence of mitral stenosis.   4. The aortic valve is tricuspid. Aortic valve regurgitation is not  visualized. Mild to moderate aortic valve sclerosis/calcification is  present, without any evidence of aortic stenosis.   5. The inferior vena cava is normal in size with greater than 50%  respiratory variability, suggesting right atrial pressure of 3 mmHg.   Cardiac monitor 11/27/2019 personally reviewed Max 167 bpm 07:04pm, 11/03 Min 53 bpm 02:37am, 11/02 Avg 74 bpm <1% supraventricular ectopy 26.4% ventricular ectopy Predominant underlying rhythm was sinus rhythm Triggered events associated with PVCs   ASSESSMENT AND PLAN:  1.  PVCs: 15 to 25% on cardiac monitor.  He continues to have PVCs and his ejection fraction has remained stable.  Burden is unchanged from prior.  Unfortunately, he has developed some mild shortness of breath as well as palpitations.  He would like further therapy for his PVCs.  PVCs appear to be outflow tract related, potentially RVOT.  Due to that, we John Caldwell plan for ablation.  Risk and benefits and discussed.  Risk include bleeding, infection, tamponade, pneumothorax damage to surrounding organs, kidney damage, death, among others.  He understands these risks and has agreed to the procedure.  Current medicines are reviewed at length with the patient today.   The patient does not have concerns regarding his medicines.  The following changes were made today: None  Labs/ tests ordered today include:  Orders Placed This Encounter  Procedures   EKG 12-Lead      Disposition:   FU with John Caldwell 3 months  Signed, John Jandreau Meredith Leeds, MD  10/22/2020 4:53 PM     [address] [place] [address] [phone] (office) [phone] (fax)

## 2020-10-22 NOTE — Patient Instructions (Signed)
Medication Instructions:  Your physician recommends that you continue on your current medications as directed. Please refer to the Current Medication list given to you today.  *If you need a refill on your cardiac medications before your next appointment, please call your pharmacy*   Lab Work: None ordered If you have labs (blood work) drawn today and your tests are completely normal, you will receive your results only by: [place] (if you have MyChart) OR A paper copy in the mail If you have any lab test that is abnormal or we need to change your treatment, we will call you to review the results.   Testing/Procedures: None ordered   Follow-Up: At [facility], you and your health needs are our priority.  As part of our continuing mission to provide you with exceptional heart care, we have created designated Provider Care Teams.  These Care Teams include your primary Cardiologist (physician) and Advanced Practice Providers (APPs -  Physician Assistants and Nurse Practitioners) who all work together to provide you with the care you need, when you need it.  Your next appointment:   6 month(s)  The format for your next appointment:   In Person  Provider:   Allegra Lai, MD    Thank you for choosing [facility]!!   Trinidad Curet, RN [phone]   Other Instructions   The following dates are available for ablation (these are subject to change):  2022---     12/2, 12/5, 12/13, 12/14, 12/19, 12/21,  2023---     1/05, 1/10, 1/13, 1/16, 1/18, 1/24, 1/27

## 2020-11-05 ENCOUNTER — Other Ambulatory Visit: Payer: Self-pay

## 2020-11-05 ENCOUNTER — Other Ambulatory Visit: Payer: Medicare Other | Admitting: *Deleted

## 2020-11-05 DIAGNOSIS — Z01812 Encounter for preprocedural laboratory examination: Secondary | ICD-10-CM

## 2020-11-05 DIAGNOSIS — R002 Palpitations: Secondary | ICD-10-CM

## 2020-11-05 LAB — CBC
Hematocrit: 42.9 % (ref 37.5–51.0)
Hemoglobin: 14.6 g/dL (ref 13.0–17.7)
MCH: 31.6 pg (ref 26.6–33.0)
MCHC: 34 g/dL (ref 31.5–35.7)
MCV: 93 fL (ref 79–97)
Platelets: 255 10*3/uL (ref 150–450)
RBC: 4.62 x10E6/uL (ref 4.14–5.80)
RDW: 13.1 % (ref 11.6–15.4)
WBC: 5 10*3/uL (ref 3.4–10.8)

## 2020-11-05 LAB — BASIC METABOLIC PANEL
BUN/Creatinine Ratio: 19 (ref 10–24)
BUN: 17 mg/dL (ref 8–27)
CO2: 31 mmol/L — ABNORMAL HIGH (ref 20–29)
Calcium: 10.1 mg/dL (ref 8.6–10.2)
Chloride: 102 mmol/L (ref 96–106)
Creatinine, Ser: 0.9 mg/dL (ref 0.76–1.27)
Glucose: 95 mg/dL (ref 70–99)
Potassium: 4.8 mmol/L (ref 3.5–5.2)
Sodium: 139 mmol/L (ref 134–144)
eGFR: 94 mL/min/{1.73_m2} (ref >59)

## 2020-11-05 NOTE — Progress Notes (Signed)
Pt scheduled for PVC ablation 11/16/2020 with Dr Curt Bears.  Pt has walked in today for labs.  Orders placed.

## 2020-11-15 NOTE — Pre-Procedure Instructions (Signed)
Attempted to call patient regarding procedure instructions.  Left voice mail on the following items: Arrival time 0830 Nothing to eat or drink after midnight No meds AM of procedure Responsible person to drive you home and stay with you for 24 hrs     

## 2020-11-16 ENCOUNTER — Ambulatory Visit (HOSPITAL_COMMUNITY): Payer: Medicare Other | Admitting: Anesthesiology

## 2020-11-16 ENCOUNTER — Ambulatory Visit (HOSPITAL_COMMUNITY)
Admission: RE | Disposition: A | Discharge status: Home / Self Care | Payer: Self-pay | Source: Ambulatory Visit | Attending: Cardiology

## 2020-11-16 ENCOUNTER — Other Ambulatory Visit: Payer: Self-pay

## 2020-11-16 ENCOUNTER — Ambulatory Visit (HOSPITAL_COMMUNITY)
Admission: RE | Admit: 2020-11-16 | Discharge: 2020-11-16 | Disposition: A | Discharge status: Home / Self Care | Payer: Medicare Other | Source: Ambulatory Visit | Attending: Cardiology | Admitting: Cardiology

## 2020-11-16 ENCOUNTER — Encounter (HOSPITAL_COMMUNITY): Payer: Self-pay | Admitting: Cardiology

## 2020-11-16 DIAGNOSIS — I493 Ventricular premature depolarization: Secondary | ICD-10-CM | POA: Diagnosis present

## 2020-11-16 HISTORY — PX: PVC ABLATION: EP1236

## 2020-11-16 SURGERY — PVC ABLATION
Anesthesia: Monitor Anesthesia Care

## 2020-11-16 MED ORDER — ISOPROTERENOL HCL 0.2 MG/ML IJ SOLN
INTRAVENOUS | Status: DC | PRN
  Administered 2020-11-16: 1 ug/min via INTRAVENOUS

## 2020-11-16 MED ORDER — HEPARIN SODIUM (PORCINE) 1000 UNIT/ML IJ SOLN
INTRAMUSCULAR | Status: AC
  Filled 2020-11-16: qty 1

## 2020-11-16 MED ORDER — SODIUM CHLORIDE 0.9% FLUSH: 3.0000 mL | INTRAVENOUS | Status: DC | PRN

## 2020-11-16 MED ORDER — HEPARIN (PORCINE) IN NACL 1000-0.9 UT/500ML-% IV SOLN
INTRAVENOUS | Status: AC
  Filled 2020-11-16: qty 500

## 2020-11-16 MED ORDER — SODIUM CHLORIDE 0.9 % IV SOLN: 250.0000 mL | INTRAVENOUS | Status: DC | PRN

## 2020-11-16 MED ORDER — HEPARIN (PORCINE) IN NACL 1000-0.9 UT/500ML-% IV SOLN
INTRAVENOUS | Status: DC | PRN
  Administered 2020-11-16 (×2): 500 mL

## 2020-11-16 MED ORDER — FENTANYL CITRATE (PF) 100 MCG/2ML IJ SOLN: 25.0000 ug | INTRAMUSCULAR | Status: DC | PRN

## 2020-11-16 MED ORDER — PROPOFOL 500 MG/50ML IV EMUL
INTRAVENOUS | Status: DC | PRN
  Administered 2020-11-16: 75 ug/kg/min via INTRAVENOUS

## 2020-11-16 MED ORDER — BUPIVACAINE HCL (PF) 0.25 % IJ SOLN
INTRAMUSCULAR | Status: AC
  Filled 2020-11-16: qty 30

## 2020-11-16 MED ORDER — ONDANSETRON HCL 4 MG/2ML IJ SOLN: 4.0000 mg | Freq: Four times a day (QID) | INTRAMUSCULAR | Status: DC | PRN

## 2020-11-16 MED ORDER — HEPARIN (PORCINE) IN NACL 2000-0.9 UNIT/L-% IV SOLN
INTRAVENOUS | Status: DC | PRN
  Administered 2020-11-16: 1000 mL

## 2020-11-16 MED ORDER — HEPARIN SODIUM (PORCINE) 1000 UNIT/ML IJ SOLN
INTRAMUSCULAR | Status: DC | PRN
  Administered 2020-11-16: 5000 [IU] via INTRAVENOUS

## 2020-11-16 MED ORDER — MIDAZOLAM HCL 2 MG/2ML IJ SOLN
INTRAMUSCULAR | Status: DC | PRN
  Administered 2020-11-16: 2 mg via INTRAVENOUS

## 2020-11-16 MED ORDER — FENTANYL CITRATE (PF) 250 MCG/5ML IJ SOLN
INTRAMUSCULAR | Status: DC | PRN
  Administered 2020-11-16: 50 ug via INTRAVENOUS

## 2020-11-16 MED ORDER — ISOPROTERENOL HCL 0.2 MG/ML IJ SOLN
INTRAMUSCULAR | Status: AC
  Filled 2020-11-16: qty 5

## 2020-11-16 MED ORDER — SODIUM CHLORIDE 0.9% FLUSH: 3.0000 mL | Freq: Two times a day (BID) | INTRAVENOUS | Status: DC

## 2020-11-16 MED ORDER — ACETAMINOPHEN 325 MG PO TABS
650.0000 mg | ORAL_TABLET | ORAL | Status: DC | PRN
  Filled 2020-11-16: qty 2

## 2020-11-16 MED ORDER — PROPOFOL 10 MG/ML IV BOLUS
INTRAVENOUS | Status: DC | PRN
  Administered 2020-11-16: 40 mg via INTRAVENOUS
  Administered 2020-11-16: 25 mg via INTRAVENOUS
  Administered 2020-11-16: 30 mg via INTRAVENOUS

## 2020-11-16 MED ORDER — SODIUM CHLORIDE 0.9 % IV SOLN: INTRAVENOUS | Status: DC

## 2020-11-16 MED ORDER — ONDANSETRON HCL 4 MG/2ML IJ SOLN
INTRAMUSCULAR | Status: DC | PRN
  Administered 2020-11-16: 4 mg via INTRAVENOUS

## 2020-11-16 MED ORDER — LIDOCAINE 2% (20 MG/ML) 5 ML SYRINGE: INTRAMUSCULAR | Status: DC | PRN

## 2020-11-16 MED ORDER — BUPIVACAINE HCL (PF) 0.25 % IJ SOLN
INTRAMUSCULAR | Status: DC | PRN
  Administered 2020-11-16: 30 mL

## 2020-11-16 SURGICAL SUPPLY — 16 items
BAG SNAP BAND KOVER 36X36 (MISCELLANEOUS) ×1 IMPLANT
CATH 8FR REPROCESSED SOUNDSTAR (CATHETERS) ×2 IMPLANT
CATH 8FR SOUNDSTAR REPROCESSED (CATHETERS) IMPLANT
CATH JOSEPH QUAD ALLRED 6F REP (CATHETERS) ×1 IMPLANT
CATH SMTCH THERMOCOOL SF DF (CATHETERS) ×1 IMPLANT
CLOSURE PERCLOSE PROSTYLE (VASCULAR PRODUCTS) ×4 IMPLANT
COVER SWIFTLINK CONNECTOR (BAG) ×1 IMPLANT
PACK EP LATEX FREE (CUSTOM PROCEDURE TRAY) ×2
PACK EP LF (CUSTOM PROCEDURE TRAY) ×1 IMPLANT
PAD PRO RADIOLUCENT 2001M-C (PAD) ×2 IMPLANT
PATCH CARTO3 (PAD) ×1 IMPLANT
SHEATH PINNACLE 6F 10CM (SHEATH) ×1 IMPLANT
SHEATH PINNACLE 8F 10CM (SHEATH) ×2 IMPLANT
SHEATH PINNACLE 9F 10CM (SHEATH) ×1 IMPLANT
SHEATH PROBE COVER 6X72 (BAG) ×1 IMPLANT
TUBING SMART ABLATE COOLFLOW (TUBING) ×1 IMPLANT

## 2020-11-16 NOTE — Transfer of Care (Signed)
Immediate Anesthesia Transfer of Care Note  Patient: John Caldwell  Procedure(s) Performed: PVC ABLATION  Patient Location: Cath Lab  Anesthesia Type:MAC  Level of Consciousness: awake, oriented and patient cooperative  Airway & Oxygen Therapy: Patient Spontanous Breathing and Patient connected to nasal cannula oxygen  Post-op Assessment: Report given to RN and Post -op Vital signs reviewed and stable  Post vital signs: Reviewed  Last Vitals:  Vitals Value Taken Time  BP 130/72 11/16/20 1225  Temp 37.1 C 11/16/20 1219  Pulse 61 11/16/20 1226  Resp 11 11/16/20 1226  SpO2 98 % 11/16/20 1226  Vitals shown include unvalidated device data.  Last Pain:  Vitals:   11/16/20 1219  TempSrc: Temporal  PainSc: 0-No pain      Patients Stated Pain Goal: 5 (06/00/45 9977)  Complications: There were no known notable events for this encounter.

## 2020-11-16 NOTE — Anesthesia Postprocedure Evaluation (Signed)
Anesthesia Post Note  Patient: John Caldwell  Procedure(s) Performed: PVC ABLATION     Patient location during evaluation: PACU Anesthesia Type: MAC Level of consciousness: awake and alert Pain management: pain level controlled Vital Signs Assessment: post-procedure vital signs reviewed and stable Respiratory status: spontaneous breathing, nonlabored ventilation, respiratory function stable and patient connected to nasal cannula oxygen Cardiovascular status: stable and blood pressure returned to baseline Postop Assessment: no apparent nausea or vomiting Anesthetic complications: no   There were no known notable events for this encounter.  Last Vitals:  Vitals:   11/16/20 1251 11/16/20 1300  BP:  (!) 150/83  Pulse:  63  Resp:    Temp: 36.9 C   SpO2:  97%    Last Pain:  Vitals:   11/16/20 1300  TempSrc:   PainSc: 0-No pain                 Tanishia Lemaster L Sade Mehlhoff

## 2020-11-16 NOTE — Discharge Instructions (Signed)

## 2020-11-16 NOTE — Progress Notes (Signed)
Pt ambulated without difficulty or bleeding.   Discharged home with his wife who will drive and stay with pt x 24 hrs. 

## 2020-11-16 NOTE — Interval H&P Note (Signed)
History and Physical Interval Note:  11/16/2020 9:41 AM  John Caldwell  has presented today for surgery, with the diagnosis of pvc.  The various methods of treatment have been discussed with the patient and family. After consideration of risks, benefits and other options for treatment, the patient has consented to  Procedure(s): PVC ABLATION (N/A) as a surgical intervention.  The patient's history has been reviewed, patient examined, no change in status, stable for surgery.  I have reviewed the patient's chart and labs.  Questions were answered to the patient's satisfaction.     Erric Machnik [employer]

## 2020-11-16 NOTE — Anesthesia Preprocedure Evaluation (Addendum)
Anesthesia Evaluation  Patient identified by MRN, date of birth, ID band Patient awake    Reviewed: Allergy & Precautions, NPO status , Patient's Chart, lab work & pertinent test results  Airway Mallampati: II  TM Distance: >3 FB Neck ROM: Full    Dental  (+) Teeth Intact, Dental Advisory Given   Pulmonary neg pulmonary ROS,    breath sounds clear to auscultation       Cardiovascular + dysrhythmias (PVCs)  Rhythm:Regular Rate:Normal  TTE 2022 1. Left ventricular ejection fraction, by estimation, is 55 to 60%. The  left ventricle has normal function. The left ventricle demonstrates  regional wall motion abnormalities (see scoring diagram/findings for  description). Left ventricular diastolic  parameters are consistent with Grade I diastolic dysfunction (impaired  relaxation). There is mild hypokinesis of the left ventricular, basal  inferior wall.  2. Right ventricular systolic function is normal. The right ventricular  size is normal. Tricuspid regurgitation signal is inadequate for assessing  PA pressure.  3. The mitral valve is normal in structure. Mild mitral valve  regurgitation. No evidence of mitral stenosis.  4. The aortic valve is tricuspid. Aortic valve regurgitation is not  visualized. Mild to moderate aortic valve sclerosis/calcification is  present, without any evidence of aortic stenosis.  5. The inferior vena cava is normal in size with greater than 50%  respiratory variability, suggesting right atrial pressure of 3 mmHg.  Stress Test 2021 Negative adequate stress test for ischemia. Frequent PVCs, including ventricular bigeminy, ventricular trigeminy, and couplets. PVC burden diminished at peak exercise and returned in recovery.    Neuro/Psych negative neurological ROS  negative psych ROS   GI/Hepatic negative GI ROS, Neg liver ROS,   Endo/Other  negative endocrine ROS  Renal/GU negative Renal ROS   negative genitourinary   Musculoskeletal negative musculoskeletal ROS (+)   Abdominal   Peds  Hematology negative hematology ROS (+)   Anesthesia Other Findings   Reproductive/Obstetrics                           Anesthesia Physical Anesthesia Plan  ASA: 2  Anesthesia Plan: MAC   Post-op Pain Management:    Induction: Intravenous  PONV Risk Score and Plan: 1 and Propofol infusion, Treatment may vary due to age or medical condition and Midazolam  Airway Management Planned: Natural Airway  Additional Equipment:   Intra-op Plan:   Post-operative Plan:   Informed Consent: I have reviewed the patients History and Physical, chart, labs and discussed the procedure including the risks, benefits and alternatives for the proposed anesthesia with the patient or authorized representative who has indicated his/her understanding and acceptance.     Dental advisory given  Plan Discussed with: CRNA  Anesthesia Plan Comments:         Anesthesia Quick Evaluation

## 2020-11-16 NOTE — Progress Notes (Signed)
Patient forgot his wallet after discharge. Writer notified patient and he stated his wife will pick it up later today. Writer discharging last patient at 6:15 and unit will be closed. Writer updated patient that his wallet will be with security and to notify them when he comes to pick up his wallet. Patient stated his wife will be coming to pick it up and expressed understanding. Wallet in security office at present.

## 2020-11-16 NOTE — Anesthesia Procedure Notes (Signed)
Procedure Name: MAC Date/Time: 11/16/2020 10:35 AM Performed by: Jenne Campus, CRNA Pre-anesthesia Checklist: Patient identified, Emergency Drugs available, Suction available and Patient being monitored Oxygen Delivery Method: Simple face mask

## 2020-11-19 MED FILL — Bupivacaine HCl Preservative Free (PF) Inj 0.25%: INTRAMUSCULAR | Qty: 30 | Status: AC

## 2020-11-22 ENCOUNTER — Telehealth: Payer: Self-pay | Admitting: Cardiology

## 2020-11-22 NOTE — Telephone Encounter (Signed)
Patient states the incision site from his ablation has some redness and bruising about 4 in in diameter. He says he had the procedure done last Friday. He says he also a little knot and is not sure if it will go away soon. He says he will be traveling on Sunday and wants to make sure he is okay for that.

## 2020-11-22 NOTE — Telephone Encounter (Signed)
Discussed concerns with pt.  Informed normal post ablation. Pt advised to keep eye on redness and let us know if worsens, or if any drainage begins from site. He will also keep eye on knot, but states it has not worsened in several days. He appreciates the call back.

## 2020-12-18 ENCOUNTER — Ambulatory Visit (INDEPENDENT_AMBULATORY_CARE_PROVIDER_SITE_OTHER): Payer: Medicare Other | Admitting: Cardiology

## 2020-12-18 ENCOUNTER — Other Ambulatory Visit: Payer: Self-pay

## 2020-12-18 ENCOUNTER — Encounter: Payer: Self-pay | Admitting: Cardiology

## 2020-12-18 VITALS — BP 108/76 | HR 62 | Ht 73.0 in | Wt 177.4 lb

## 2020-12-18 DIAGNOSIS — I493 Ventricular premature depolarization: Secondary | ICD-10-CM

## 2020-12-18 NOTE — Progress Notes (Signed)
Electrophysiology Office Note   Date:  12/18/2020   ID:  John Caldwell, DOB 16-Sep-1955, MRN [id number]  PCP:  Carolee Rota, NP  Cardiologist:  Johnsie Cancel Primary Electrophysiologist:  Arlie Riker Meredith Leeds, MD    Chief Complaint: PVC   History of Present Illness: John Caldwell is a 65 y.o. male who is being seen today for the evaluation of PVC at the request of Carolee Rota, NP. Presenting today for electrophysiology evaluation.  He has a history significant palpitations that date back to 2011.  At the time it was thought due to doxycycline.  He wore a cardiac monitor that showed a PVC burden of 15% with a repeat monitor showing a 25% burden.  He has shortness of breath and fatigue as well as significant palpitations associated with the PVCs.  He is now status post ablation 11/16/2020.  PVCs were originating from inferior to the right/left coronary cusp.  Today, denies symptoms of palpitations, chest pain, shortness of breath, orthopnea, PND, lower extremity edema, claudication, dizziness, presyncope, syncope, bleeding, or neurologic sequela. The patient is tolerating medications without difficulties.  Since his ablation he has done well.  He has had no further PVCs.  He is able to do all of his daily activities.  He is able to exercise and exert himself without issue.  He is overall pleased with his control.  Past Medical History:  Diagnosis Date   Allergic rhinitis    Basal cell carcinoma (BCC) of chest    ED (erectile dysfunction)    Heart block AV first degree    Idiopathic peripheral neuropathy    Numbness of toes    Palpitations    Peripheral neuropathy 09/07/2018   Skin cancer    of the cheek   Past Surgical History:  Procedure Laterality Date   PVC ABLATION N/A 11/16/2020   Procedure: PVC ABLATION;  Surgeon: Constance Haw, MD;  Location: [facility];  Service: Cardiovascular;  Laterality: N/A;   SKIN CANCER EXCISION  2019     Current Outpatient Medications   Medication Sig Dispense Refill   multivitamin (ONE-A-DAY MEN'S) TABS tablet Take 1 tablet by mouth 2 (two) times a week.     No current facility-administered medications for this visit.    Allergies:   Pollen extract   Social History:  The patient  reports that he has never smoked. He has never used smokeless tobacco. He reports that he does not drink alcohol and does not use drugs.   Family History:  The patient's family history includes Cancer in his father and mother; Dementia in his father; Glaucoma in his mother.   ROS:  Please see the history of present illness.   Otherwise, review of systems is positive for none.   All other systems are reviewed and negative.   PHYSICAL EXAM: VS:  BP 108/76   Pulse 62   Ht 6\' 1"  (1.854 m)   Wt 177 lb 6.4 oz (80.5 kg)   SpO2 96%   BMI 23.41 kg/m  , BMI Body mass index is 23.41 kg/m. GEN: Well nourished, well developed, in no acute distress  HEENT: normal  Neck: no JVD, carotid bruits, or masses Cardiac: RRR; no murmurs, rubs, or gallops,no edema  Respiratory:  clear to auscultation bilaterally, normal work of breathing GI: soft, nontender, nondistended, + BS MS: no deformity or atrophy  Skin: warm and dry Neuro:  Strength and sensation are intact Psych: euthymic mood, full affect  EKG:  EKG is ordered  today. Personal review of the ekg ordered shows sinus rhythm  Recent Labs: 11/05/2020: BUN 17; Creatinine, Ser 0.90; Hemoglobin 14.6; Platelets 255; Potassium 4.8; Sodium 139    Lipid Panel  No results found for: CHOL, TRIG, HDL, CHOLHDL, VLDL, LDLCALC, LDLDIRECT   Wt Readings from Last 3 Encounters:  12/18/20 177 lb 6.4 oz (80.5 kg)  11/16/20 170 lb (77.1 kg)  10/22/20 178 lb (80.7 kg)      Other studies Reviewed: Additional studies/ records that were reviewed today include: TTE 04/18/2020 Review of the above records today demonstrates:   1. Left ventricular ejection fraction, by estimation, is 55 to 60%. The  left  ventricle has normal function. The left ventricle demonstrates  regional wall motion abnormalities (see scoring diagram/findings for  description). Left ventricular diastolic  parameters are consistent with Grade I diastolic dysfunction (impaired  relaxation). There is mild hypokinesis of the left ventricular, basal  inferior wall.   2. Right ventricular systolic function is normal. The right ventricular  size is normal. Tricuspid regurgitation signal is inadequate for assessing  PA pressure.   3. The mitral valve is normal in structure. Mild mitral valve  regurgitation. No evidence of mitral stenosis.   4. The aortic valve is tricuspid. Aortic valve regurgitation is not  visualized. Mild to moderate aortic valve sclerosis/calcification is  present, without any evidence of aortic stenosis.   5. The inferior vena cava is normal in size with greater than 50%  respiratory variability, suggesting right atrial pressure of 3 mmHg.   Cardiac monitor 11/27/2019 personally reviewed Max 167 bpm 07:04pm, 11/03 Min 53 bpm 02:37am, 11/02 Avg 74 bpm <1% supraventricular ectopy 26.4% ventricular ectopy Predominant underlying rhythm was sinus rhythm Triggered events associated with PVCs   ASSESSMENT AND PLAN:  1.  PVCs: Found to be at 15 to 25% on cardiac monitor.  He has now status post PVC ablation on 11/16/2020.  PVCs were originating from the left ventricle inferior to the right left coronary cusp.  He has had no further PVCs and is felt well.  He is not on any medications.  We Keshonda Monsour continue with current management.  I Lylla Eifler see him back on an as-needed basis.  Current medicines are reviewed at length with the patient today.   The patient does not have concerns regarding his medicines.  The following changes were made today: None  Labs/ tests ordered today include:  Orders Placed This Encounter  Procedures   EKG 12-Lead       Disposition:   FU with Verba Ainley as needed  months  Signed, Rayven Hendrickson Meredith Leeds, MD  12/18/2020 12:36 PM     [address] [place] [id number] [phone] (office) [phone] (fax)

## 2021-02-19 DIAGNOSIS — L03115 Cellulitis of right lower limb: Secondary | ICD-10-CM | POA: Diagnosis not present

## 2021-03-01 DIAGNOSIS — L729 Follicular cyst of the skin and subcutaneous tissue, unspecified: Secondary | ICD-10-CM | POA: Diagnosis not present

## 2021-04-04 ENCOUNTER — Encounter: Payer: Self-pay | Admitting: Podiatry

## 2021-04-04 ENCOUNTER — Ambulatory Visit (INDEPENDENT_AMBULATORY_CARE_PROVIDER_SITE_OTHER): Payer: Medicare Other

## 2021-04-04 ENCOUNTER — Ambulatory Visit: Payer: Medicare Other | Admitting: Podiatry

## 2021-04-04 ENCOUNTER — Other Ambulatory Visit: Payer: Self-pay

## 2021-04-04 DIAGNOSIS — M2141 Flat foot [pes planus] (acquired), right foot: Secondary | ICD-10-CM | POA: Diagnosis not present

## 2021-04-04 DIAGNOSIS — G5793 Unspecified mononeuropathy of bilateral lower limbs: Secondary | ICD-10-CM | POA: Diagnosis not present

## 2021-04-04 DIAGNOSIS — M2140 Flat foot [pes planus] (acquired), unspecified foot: Secondary | ICD-10-CM

## 2021-04-04 DIAGNOSIS — M2142 Flat foot [pes planus] (acquired), left foot: Secondary | ICD-10-CM

## 2021-04-04 NOTE — Progress Notes (Signed)
?  Subjective:  ?Patient ID: John Caldwell, male    DOB: 12/25/1955,  MRN: [id number] ?HPI ?Chief Complaint  ?Patient presents with  ? Foot Pain  ?  Flat feet bilateral - has pain sometimes, more neuropathy issues, seen chiropractor yesterday and he may be able to help with neuropathy  ? New Patient (Initial Visit)  ? ? ?66 y.o. male presents with the above complaint.  ? ?ROS: Denies fever chills nausea vomiting muscle aches pains calf pain back pain chest pain shortness of breath. ? ?Past Medical History:  ?Diagnosis Date  ? Allergic rhinitis   ? Basal cell carcinoma (BCC) of chest   ? ED (erectile dysfunction)   ? Heart block AV first degree   ? Idiopathic peripheral neuropathy   ? Numbness of toes   ? Palpitations   ? Peripheral neuropathy 09/07/2018  ? Skin cancer   ? of the cheek  ? ?Past Surgical History:  ?Procedure Laterality Date  ? PVC ABLATION N/A 11/16/2020  ? Procedure: PVC ABLATION;  Surgeon: Constance Haw, MD;  Location: [facility];  Service: Cardiovascular;  Laterality: N/A;  ? SKIN CANCER EXCISION  2019  ? ? ?Current Outpatient Medications:  ?  multivitamin (ONE-A-DAY MEN'S) TABS tablet, Take 1 tablet by mouth 2 (two) times a week., Disp: , Rfl:  ? ?Allergies  ?Allergen Reactions  ? Pollen Extract   ?  Sneezing, watery eyes  ? ?Review of Systems ?Objective:  ?There were no vitals filed for this visit. ? ?General: Well developed, nourished, in no acute distress, alert and oriented x3  ? ?Dermatological: Skin is warm, dry and supple bilateral. Nails x 10 are well maintained; remaining integument appears unremarkable at this time. There are no open sores, no preulcerative lesions, no rash or signs of infection present. ? ?Vascular: Dorsalis Pedis artery and Posterior Tibial artery pedal pulses are 2/4 bilateral with immedate capillary fill time. Pedal hair growth present. No varicosities and no lower extremity edema present bilateral.  ? ?Neruologic: Grossly intact via light touch bilateral.  Vibratory intact via tuning fork bilateral. Protective threshold with Semmes Wienstein monofilament intact to all pedal sites bilateral. Patellar and Achilles deep tendon reflexes 2+ bilateral. No Babinski or clonus noted bilateral. ? ?He states that he been to neurology previously did a nerve conduction velocity exam and lots of blood work and never figure out what was causing the numbness and tingling in his toes.  States that seems to be worse at night. ? ?Musculoskeletal: No gross boney pedal deformities bilateral. No pain, crepitus, or limitation noted with foot and ankle range of motion bilateral. Muscular strength 5/5 in all groups tested bilateral.  Flexible pes planus bilateral no reproducible pain.  He goes on to say that his legs become more fatigued after activity. ? ?Gait: Unassisted, Nonantalgic.  ? ? ?Radiographs: ? ?Radiographs taken today demonstrate mild pes planus of an osseously mature individual no acute findings noted.  Mildly elevated first metatarsal with some early osteoarthritic changes. ? ?Assessment & Plan:  ? ?Assessment: Pes planus with early osteoarthritic changes first metatarsophalangeal joint. ? ?Idiopathic neuropathy. ? ?Plan: Idiopathic neuropathy.  Pes planovalgus. ? ?I offered orthotics he declined we discussed appropriate shoe gear.  Discussed oral medication for the neuropathy. ? ? ? ? ?Johann Santone T. [place], DPM ?

## 2021-05-20 DIAGNOSIS — S80869A Insect bite (nonvenomous), unspecified lower leg, initial encounter: Secondary | ICD-10-CM | POA: Diagnosis not present

## 2021-08-29 DIAGNOSIS — Z0000 Encounter for general adult medical examination without abnormal findings: Secondary | ICD-10-CM | POA: Diagnosis not present

## 2021-08-29 DIAGNOSIS — E785 Hyperlipidemia, unspecified: Secondary | ICD-10-CM | POA: Diagnosis not present

## 2021-11-05 DIAGNOSIS — L570 Actinic keratosis: Secondary | ICD-10-CM | POA: Diagnosis not present

## 2021-11-05 DIAGNOSIS — L821 Other seborrheic keratosis: Secondary | ICD-10-CM | POA: Diagnosis not present

## 2021-11-15 DIAGNOSIS — D125 Benign neoplasm of sigmoid colon: Secondary | ICD-10-CM | POA: Diagnosis not present

## 2021-11-15 DIAGNOSIS — Z86010 Personal history of colonic polyps: Secondary | ICD-10-CM | POA: Diagnosis not present

## 2021-11-15 DIAGNOSIS — D122 Benign neoplasm of ascending colon: Secondary | ICD-10-CM | POA: Diagnosis not present

## 2021-11-15 DIAGNOSIS — K648 Other hemorrhoids: Secondary | ICD-10-CM | POA: Diagnosis not present

## 2021-11-15 DIAGNOSIS — Z09 Encounter for follow-up examination after completed treatment for conditions other than malignant neoplasm: Secondary | ICD-10-CM | POA: Diagnosis not present

## 2021-11-19 DIAGNOSIS — D122 Benign neoplasm of ascending colon: Secondary | ICD-10-CM | POA: Diagnosis not present

## 2022-03-14 DIAGNOSIS — D225 Melanocytic nevi of trunk: Secondary | ICD-10-CM | POA: Diagnosis not present

## 2022-03-14 DIAGNOSIS — L814 Other melanin hyperpigmentation: Secondary | ICD-10-CM | POA: Diagnosis not present

## 2022-03-14 DIAGNOSIS — L821 Other seborrheic keratosis: Secondary | ICD-10-CM | POA: Diagnosis not present

## 2022-06-30 DIAGNOSIS — H25813 Combined forms of age-related cataract, bilateral: Secondary | ICD-10-CM | POA: Diagnosis not present

## 2022-07-01 DIAGNOSIS — L538 Other specified erythematous conditions: Secondary | ICD-10-CM | POA: Diagnosis not present

## 2022-07-01 DIAGNOSIS — Z789 Other specified health status: Secondary | ICD-10-CM | POA: Diagnosis not present

## 2022-07-01 DIAGNOSIS — L820 Inflamed seborrheic keratosis: Secondary | ICD-10-CM | POA: Diagnosis not present

## 2022-07-01 DIAGNOSIS — R208 Other disturbances of skin sensation: Secondary | ICD-10-CM | POA: Diagnosis not present

## 2022-08-25 DIAGNOSIS — H35372 Puckering of macula, left eye: Secondary | ICD-10-CM | POA: Diagnosis not present

## 2022-08-25 DIAGNOSIS — H25813 Combined forms of age-related cataract, bilateral: Secondary | ICD-10-CM | POA: Diagnosis not present

## 2022-08-25 DIAGNOSIS — H52223 Regular astigmatism, bilateral: Secondary | ICD-10-CM | POA: Diagnosis not present

## 2022-09-01 DIAGNOSIS — E785 Hyperlipidemia, unspecified: Secondary | ICD-10-CM | POA: Diagnosis not present

## 2022-09-01 DIAGNOSIS — Z0000 Encounter for general adult medical examination without abnormal findings: Secondary | ICD-10-CM | POA: Diagnosis not present

## 2022-09-18 DIAGNOSIS — H25813 Combined forms of age-related cataract, bilateral: Secondary | ICD-10-CM | POA: Diagnosis not present

## 2022-09-23 DIAGNOSIS — H52222 Regular astigmatism, left eye: Secondary | ICD-10-CM | POA: Diagnosis not present

## 2022-09-23 DIAGNOSIS — H25812 Combined forms of age-related cataract, left eye: Secondary | ICD-10-CM | POA: Diagnosis not present

## 2022-10-08 DIAGNOSIS — L538 Other specified erythematous conditions: Secondary | ICD-10-CM | POA: Diagnosis not present

## 2022-10-08 DIAGNOSIS — L298 Other pruritus: Secondary | ICD-10-CM | POA: Diagnosis not present

## 2022-10-08 DIAGNOSIS — L820 Inflamed seborrheic keratosis: Secondary | ICD-10-CM | POA: Diagnosis not present

## 2022-10-08 DIAGNOSIS — L821 Other seborrheic keratosis: Secondary | ICD-10-CM | POA: Diagnosis not present

## 2022-10-08 DIAGNOSIS — L814 Other melanin hyperpigmentation: Secondary | ICD-10-CM | POA: Diagnosis not present

## 2022-10-08 DIAGNOSIS — R208 Other disturbances of skin sensation: Secondary | ICD-10-CM | POA: Diagnosis not present

## 2022-10-08 DIAGNOSIS — D225 Melanocytic nevi of trunk: Secondary | ICD-10-CM | POA: Diagnosis not present

## 2022-10-08 DIAGNOSIS — Z85828 Personal history of other malignant neoplasm of skin: Secondary | ICD-10-CM | POA: Diagnosis not present

## 2022-10-08 DIAGNOSIS — Z08 Encounter for follow-up examination after completed treatment for malignant neoplasm: Secondary | ICD-10-CM | POA: Diagnosis not present

## 2022-10-08 DIAGNOSIS — Z789 Other specified health status: Secondary | ICD-10-CM | POA: Diagnosis not present

## 2022-10-23 DIAGNOSIS — H52223 Regular astigmatism, bilateral: Secondary | ICD-10-CM | POA: Diagnosis not present

## 2022-10-23 DIAGNOSIS — H52221 Regular astigmatism, right eye: Secondary | ICD-10-CM | POA: Diagnosis not present

## 2022-10-23 DIAGNOSIS — Z961 Presence of intraocular lens: Secondary | ICD-10-CM | POA: Diagnosis not present

## 2022-10-23 DIAGNOSIS — Z9842 Cataract extraction status, left eye: Secondary | ICD-10-CM | POA: Diagnosis not present

## 2022-10-23 DIAGNOSIS — H25811 Combined forms of age-related cataract, right eye: Secondary | ICD-10-CM | POA: Diagnosis not present

## 2023-02-06 DIAGNOSIS — Z03818 Encounter for observation for suspected exposure to other biological agents ruled out: Secondary | ICD-10-CM | POA: Diagnosis not present

## 2023-02-06 DIAGNOSIS — R051 Acute cough: Secondary | ICD-10-CM | POA: Diagnosis not present

## 2023-02-06 DIAGNOSIS — J069 Acute upper respiratory infection, unspecified: Secondary | ICD-10-CM | POA: Diagnosis not present

## 2023-03-18 DIAGNOSIS — L821 Other seborrheic keratosis: Secondary | ICD-10-CM | POA: Diagnosis not present

## 2023-03-18 DIAGNOSIS — L814 Other melanin hyperpigmentation: Secondary | ICD-10-CM | POA: Diagnosis not present

## 2023-03-18 DIAGNOSIS — Z85828 Personal history of other malignant neoplasm of skin: Secondary | ICD-10-CM | POA: Diagnosis not present

## 2023-03-18 DIAGNOSIS — D225 Melanocytic nevi of trunk: Secondary | ICD-10-CM | POA: Diagnosis not present

## 2023-03-18 DIAGNOSIS — Z08 Encounter for follow-up examination after completed treatment for malignant neoplasm: Secondary | ICD-10-CM | POA: Diagnosis not present

## 2023-03-18 DIAGNOSIS — L820 Inflamed seborrheic keratosis: Secondary | ICD-10-CM | POA: Diagnosis not present

## 2023-05-08 ENCOUNTER — Telehealth: Payer: Self-pay

## 2023-05-08 DIAGNOSIS — G609 Hereditary and idiopathic neuropathy, unspecified: Secondary | ICD-10-CM

## 2023-05-11 ENCOUNTER — Telehealth: Payer: Self-pay | Admitting: *Deleted

## 2023-05-11 NOTE — Progress Notes (Signed)
 Complex Care Management Note Care Guide Note  05/11/2023 Name: PAPA METTLER MRN: [id number] DOB: 11/25/1955   Complex Care Management Outreach Attempts: An unsuccessful telephone outreach was attempted today to offer the patient information about available complex care management services.  Follow Up Plan:  Additional outreach attempts will be made to offer the patient complex care management information and services.   Encounter Outcome:  No Answer  Barnie Bora  [facility]  [facility], [facility] Guide  Direct Dial: [phone]  Fax [phone]

## 2023-05-11 NOTE — Progress Notes (Signed)
 Complex Care Management Note  Care Guide Note 05/11/2023 Name: John Caldwell MRN: [id number] DOB: 1955/05/26  John Caldwell is a 68 y.o. year old male who sees Leonarda Rakers, NP for primary care. I reached out to Arlinda Lais by phone today to offer complex care management services.  [name] was given information about Complex Care Management services today including:   The Complex Care Management services include support from the care team which includes your Nurse Care Manager, Clinical Social Worker, or Pharmacist.  The Complex Care Management team is here to help remove barriers to the health concerns and goals most important to you. Complex Care Management services are voluntary, and the patient may decline or stop services at any time by request to their care team member.   Complex Care Management Consent Status: Patient did not agree to participate in complex care management services at this time.  Follow up plan: None  Encounter Outcome:  Patient Refused  Barnie Bora  [facility]  [facility], [facility] Guide  Direct Dial: [phone]  Fax [phone]

## 2023-08-26 ENCOUNTER — Encounter: Payer: Self-pay | Admitting: Cardiology

## 2023-09-07 DIAGNOSIS — E785 Hyperlipidemia, unspecified: Secondary | ICD-10-CM | POA: Diagnosis not present

## 2023-09-07 DIAGNOSIS — Z131 Encounter for screening for diabetes mellitus: Secondary | ICD-10-CM | POA: Diagnosis not present

## 2023-09-07 DIAGNOSIS — Z0000 Encounter for general adult medical examination without abnormal findings: Secondary | ICD-10-CM | POA: Diagnosis not present
